# Patient Record
Sex: Female | Born: 1975 | Race: Asian | Hispanic: No | Marital: Married | Smoking: Never smoker
Health system: Southern US, Community
[De-identification: ages and names within clinical notes are randomized; demographics above are authoritative.]

## PROBLEM LIST (undated history)

## (undated) HISTORY — PX: NO PAST SURGERIES: SHX2092

---

## 2010-09-30 ENCOUNTER — Ambulatory Visit: Payer: Self-pay | Admitting: Internal Medicine

## 2013-05-06 ENCOUNTER — Ambulatory Visit: Payer: Self-pay | Admitting: Internal Medicine

## 2013-05-06 LAB — HM MAMMOGRAPHY: HM MAMMO: NORMAL

## 2014-07-07 LAB — LIPID PANEL
Cholesterol: 225 mg/dL — AB (ref 0–200)
HDL: 45 mg/dL (ref 35–70)
LDL Cholesterol: 159 mg/dL
TRIGLYCERIDES: 107 mg/dL (ref 40–160)

## 2014-07-07 LAB — HM PAP SMEAR: HM PAP: NEGATIVE

## 2014-07-07 LAB — BASIC METABOLIC PANEL
BUN: 9 mg/dL (ref 4–21)
CREATININE: 0.8 mg/dL (ref <=1.1)

## 2014-07-07 LAB — CBC AND DIFFERENTIAL
HCT: 34 % — AB (ref 36–46)
Hemoglobin: 10.6 g/dL — AB (ref 12.0–16.0)

## 2014-07-07 LAB — TSH: TSH: 3 u[IU]/mL (ref <=5.90)

## 2015-04-20 ENCOUNTER — Encounter: Payer: Self-pay | Admitting: Internal Medicine

## 2015-06-09 ENCOUNTER — Encounter: Payer: Self-pay | Admitting: Internal Medicine

## 2015-06-09 ENCOUNTER — Other Ambulatory Visit: Payer: Self-pay | Admitting: Internal Medicine

## 2015-06-09 DIAGNOSIS — M069 Rheumatoid arthritis, unspecified: Secondary | ICD-10-CM | POA: Insufficient documentation

## 2015-06-09 DIAGNOSIS — R011 Cardiac murmur, unspecified: Secondary | ICD-10-CM | POA: Insufficient documentation

## 2015-06-09 DIAGNOSIS — J3089 Other allergic rhinitis: Secondary | ICD-10-CM | POA: Insufficient documentation

## 2015-06-09 DIAGNOSIS — F5101 Primary insomnia: Secondary | ICD-10-CM | POA: Insufficient documentation

## 2015-07-17 ENCOUNTER — Ambulatory Visit (INDEPENDENT_AMBULATORY_CARE_PROVIDER_SITE_OTHER): Payer: Managed Care, Other (non HMO) | Admitting: Internal Medicine

## 2015-07-17 ENCOUNTER — Encounter: Payer: Self-pay | Admitting: Internal Medicine

## 2015-07-17 VITALS — BP 118/64 | HR 68 | Ht 64.0 in | Wt 122.4 lb

## 2015-07-17 DIAGNOSIS — M05742 Rheumatoid arthritis with rheumatoid factor of left hand without organ or systems involvement: Secondary | ICD-10-CM

## 2015-07-17 DIAGNOSIS — Z1239 Encounter for other screening for malignant neoplasm of breast: Secondary | ICD-10-CM | POA: Diagnosis not present

## 2015-07-17 DIAGNOSIS — Z0000 Encounter for general adult medical examination without abnormal findings: Secondary | ICD-10-CM

## 2015-07-17 DIAGNOSIS — M05741 Rheumatoid arthritis with rheumatoid factor of right hand without organ or systems involvement: Secondary | ICD-10-CM

## 2015-07-17 DIAGNOSIS — E785 Hyperlipidemia, unspecified: Secondary | ICD-10-CM | POA: Diagnosis not present

## 2015-07-17 LAB — POCT URINALYSIS DIPSTICK
Bilirubin, UA: NEGATIVE
GLUCOSE UA: NEGATIVE
Ketones, UA: NEGATIVE
Leukocytes, UA: NEGATIVE
Nitrite, UA: NEGATIVE
PH UA: 6
Protein, UA: NEGATIVE
SPEC GRAV UA: 1.015
UROBILINOGEN UA: 0.2

## 2015-07-17 NOTE — Progress Notes (Signed)
Date:  07/17/2015   Name:  Theadora Noyes Chamberland   DOB:  1976/04/18   MRN:  [id number]   Chief Complaint: Annual Exam Melinda House is a 40 y.o. female who presents today for her Complete Annual Exam. She feels fairly well. She reports exercising some. She reports she is sleeping fairly well.   Rheumatoid Arthritis - generally worsening symptoms in both hands and wrists.  Specialist wanted her to start strong medication but she declined.  Now she is using Aleve only as needed - not daily even though she has daily symptoms. She is having more trouble with ADLS such as dressing and eating.  Review of Systems  Constitutional: Negative for fever, chills and fatigue.  HENT: Negative for hearing loss.   Eyes: Negative for visual disturbance.  Respiratory: Negative for cough, chest tightness, shortness of breath and wheezing.   Cardiovascular: Negative for chest pain, palpitations and leg swelling.  Gastrointestinal: Negative for abdominal pain, diarrhea and constipation.  Genitourinary: Negative for dysuria.  Musculoskeletal: Positive for myalgias, joint swelling and arthralgias. Negative for gait problem.  Neurological: Negative for dizziness, numbness and headaches.  Hematological: Negative for adenopathy.  Psychiatric/Behavioral: Negative for dysphoric mood.    Patient Active Problem List   Diagnosis Date Noted  . Hyperlipidemia, mild 07/17/2015  . Environmental and seasonal allergies 06/09/2015  . Cardiac murmur 06/09/2015  . Idiopathic insomnia 06/09/2015  . Rheumatoid arthritis of hand ([place]) 06/09/2015    Prior to Admission medications   Medication Sig Start Date End Date Taking? Authorizing Provider  Azelastine-Fluticasone (DYMISTA) 137-50 MCG/ACT SUSP Place 1 spray into the nose 2 (two) times daily as needed. 06/18/13  Yes Historical Provider, MD  naproxen sodium (ALEVE) 220 MG tablet Take 1 tablet by mouth 2 (two) times daily as needed.   Yes Historical Provider, MD    zaleplon (SONATA) 5 MG capsule Take 1 capsule by mouth at bedtime as needed. Reported on 07/17/2015 06/18/13   Historical Provider, MD    No Known Allergies  Past Surgical History  Procedure Laterality Date  . No past surgeries      Social History  Substance Use Topics  . Smoking status: Never Smoker   . Smokeless tobacco: None  . Alcohol Use: 1.2 oz/week    2 Standard drinks or equivalent per week     Comment: occasional    Medication list has been reviewed and updated.  Physical Exam  Constitutional: She is oriented to person, place, and time. She appears well-developed and well-nourished. No distress.  HENT:  Head: Normocephalic and atraumatic.  Right Ear: Tympanic membrane and ear canal normal.  Left Ear: Tympanic membrane and ear canal normal.  Nose: Right sinus exhibits no maxillary sinus tenderness. Left sinus exhibits no maxillary sinus tenderness.  Mouth/Throat: Uvula is midline and oropharynx is clear and moist.  Eyes: Conjunctivae and EOM are normal. Right eye exhibits no discharge. Left eye exhibits no discharge. No scleral icterus.  Neck: Normal range of motion. Carotid bruit is not present. No erythema present. No thyromegaly present.  Cardiovascular: Normal rate, regular rhythm, normal heart sounds and normal pulses.   Pulmonary/Chest: Effort normal. No respiratory distress. She has no wheezes. Right breast exhibits no mass, no nipple discharge, no skin change and no tenderness. Left breast exhibits no mass, no nipple discharge, no skin change and no tenderness.  Abdominal: Soft. Bowel sounds are normal. There is no hepatosplenomegaly. There is no tenderness. There is no CVA tenderness.  Musculoskeletal: Normal range  of motion.       Right wrist: She exhibits tenderness and swelling.       Left wrist: She exhibits tenderness and swelling.  Swelling and synovitis with tenderness of both wrists, and 3rd and 4th fingers of left hand  Lymphadenopathy:    She has no  cervical adenopathy.    She has no axillary adenopathy.  Neurological: She is alert and oriented to person, place, and time. She has normal reflexes. No cranial nerve deficit or sensory deficit.  Skin: Skin is warm, dry and intact. No rash noted.  Psychiatric: She has a normal mood and affect. Her speech is normal and behavior is normal. Thought content normal.  Nursing note and vitals reviewed.   BP 118/64 mmHg  Pulse 68  Ht 5' 4"  (1.626 m)  Wt 122 lb 6.4 oz (55.52 kg)  BMI 21.00 kg/m2  Assessment and Plan: 1. Annual physical exam Normal exam except for arthritis - POCT Urinalysis Dipstick - Comprehensive metabolic panel - TSH  2. Rheumatoid arthritis involving both hands with positive rheumatoid factor (HCC) Increasedactivity of disease recently - pt encouraged to begin more aggressive treatment Refer back to rheumatology Take Aleve twice a day - Rheumatoid Factor - Cyclic citrul peptide antibody, IgG - CBC with Differential/Platelet - Sedimentation rate - Ambulatory referral to Rheumatology  3. Hyperlipidemia, mild Continue healthy diet - Lipid panel  4. Breast cancer screening - MM DIGITAL SCREENING BILATERAL; Future   Halina Maidens, MD [facility]  07/17/2015

## 2015-07-17 NOTE — Patient Instructions (Signed)
Breast Self-Awareness Practicing breast self-awareness may pick up problems early, prevent significant medical complications, and possibly save your life. By practicing breast self-awareness, you can become familiar with how your breasts look and feel and if your breasts are changing. This allows you to notice changes early. It can also offer you some reassurance that your breast health is good. One way to learn what is normal for your breasts and whether your breasts are changing is to do a breast self-exam. If you find a lump or something that was not present in the past, it is best to contact your caregiver right away. Other findings that should be evaluated by your caregiver include nipple discharge, especially if it is bloody; skin changes or reddening; areas where the skin seems to be pulled in (retracted); or new lumps and bumps. Breast pain is seldom associated with cancer (malignancy), but should also be evaluated by a caregiver. HOW TO PERFORM A BREAST SELF-EXAM The best time to examine your breasts is 5-7 days after your menstrual period is over. During menstruation, the breasts are lumpier, and it may be more difficult to pick up changes. If you do not menstruate, have reached menopause, or had your uterus removed (hysterectomy), you should examine your breasts at regular intervals, such as monthly. If you are breastfeeding, examine your breasts after a feeding or after using a breast pump. Breast implants do not decrease the risk for lumps or tumors, so continue to perform breast self-exams as recommended. Talk to your caregiver about how to determine the difference between the implant and breast tissue. Also, talk about the amount of pressure you should use during the exam. Over time, you will become more familiar with the variations of your breasts and more comfortable with the exam. A breast self-exam requires you to remove all your clothes above the waist. 1. Look at your breasts and nipples.  Stand in front of a mirror in a room with good lighting. With your hands on your hips, push your hands firmly downward. Look for a difference in shape, contour, and size from one breast to the other (asymmetry). Asymmetry includes puckers, dips, or bumps. Also, look for skin changes, such as reddened or scaly areas on the breasts. Look for nipple changes, such as discharge, dimpling, repositioning, or redness. 2. Carefully feel your breasts. This is best done either in the shower or tub while using soapy water or when flat on your back. Place the arm (on the side of the breast you are examining) above your head. Use the pads (not the fingertips) of your three middle fingers on your opposite hand to feel your breasts. Start in the underarm area and use  inch (2 cm) overlapping circles to feel your breast. Use 3 different levels of pressure (light, medium, and firm pressure) at each circle before moving to the next circle. The light pressure is needed to feel the tissue closest to the skin. The medium pressure will help to feel breast tissue a little deeper, while the firm pressure is needed to feel the tissue close to the ribs. Continue the overlapping circles, moving downward over the breast until you feel your ribs below your breast. Then, move one finger-width towards the center of the body. Continue to use the  inch (2 cm) overlapping circles to feel your breast as you move slowly up toward the collar bone (clavicle) near the base of the neck. Continue the up and down exam using all 3 pressures until you reach the  middle of the chest. Do this with each breast, carefully feeling for lumps or changes. 3.  Keep a written record with breast changes or normal findings for each breast. By writing this information down, you do not need to depend only on memory for size, tenderness, or location. Write down where you are in your menstrual cycle, if you are still menstruating. Breast tissue can have some lumps or  thick tissue. However, see your caregiver if you find anything that concerns you.  SEEK MEDICAL CARE IF:  You see a change in shape, contour, or size of your breasts or nipples.   You see skin changes, such as reddened or scaly areas on the breasts or nipples.   You have an unusual discharge from your nipples.   You feel a new lump or unusually thick areas.    This information is not intended to replace advice given to you by your health care provider. Make sure you discuss any questions you have with your health care provider.   Document Released: 06/27/2005 Document Revised: 06/13/2012 Document Reviewed: 10/12/2011 Elsevier Interactive Patient Education [employer].

## 2015-07-20 LAB — LIPID PANEL
CHOL/HDL RATIO: 5 ratio — AB (ref 0.0–4.4)
Cholesterol, Total: 214 mg/dL — ABNORMAL HIGH (ref 100–199)
HDL: 43 mg/dL (ref >39)
LDL Calculated: 134 mg/dL — ABNORMAL HIGH (ref 0–99)
Triglycerides: 185 mg/dL — ABNORMAL HIGH (ref 0–149)
VLDL Cholesterol Cal: 37 mg/dL (ref 5–40)

## 2015-07-20 LAB — COMPREHENSIVE METABOLIC PANEL
A/G RATIO: 1.4 (ref 1.1–2.5)
ALBUMIN: 4.4 g/dL (ref 3.5–5.5)
ALT: 13 IU/L (ref 0–32)
AST: 18 IU/L (ref 0–40)
Alkaline Phosphatase: 85 IU/L (ref 39–117)
BILIRUBIN TOTAL: 0.4 mg/dL (ref 0.0–1.2)
BUN / CREAT RATIO: 12 (ref 8–20)
BUN: 9 mg/dL (ref 6–20)
CHLORIDE: 98 mmol/L (ref 96–106)
CO2: 19 mmol/L (ref 18–29)
Calcium: 9.9 mg/dL (ref 8.7–10.2)
Creatinine, Ser: 0.77 mg/dL (ref 0.57–1.00)
GFR calc non Af Amer: 98 mL/min/{1.73_m2} (ref >59)
GFR, EST AFRICAN AMERICAN: 112 mL/min/{1.73_m2} (ref >59)
GLOBULIN, TOTAL: 3.2 g/dL (ref 1.5–4.5)
Glucose: 87 mg/dL (ref 65–99)
POTASSIUM: 5.2 mmol/L (ref 3.5–5.2)
SODIUM: 138 mmol/L (ref 134–144)
Total Protein: 7.6 g/dL (ref 6.0–8.5)

## 2015-07-20 LAB — CBC WITH DIFFERENTIAL/PLATELET
BASOS ABS: 0.1 10*3/uL (ref 0.0–0.2)
Basos: 2 %
EOS (ABSOLUTE): 0.4 10*3/uL (ref 0.0–0.4)
EOS: 6 %
HEMATOCRIT: 35.5 % (ref 34.0–46.6)
Hemoglobin: 10.8 g/dL — ABNORMAL LOW (ref 11.1–15.9)
Immature Grans (Abs): 0 10*3/uL (ref 0.0–0.1)
Immature Granulocytes: 0 %
LYMPHS ABS: 1.7 10*3/uL (ref 0.7–3.1)
Lymphs: 25 %
MCH: 22.1 pg — ABNORMAL LOW (ref 26.6–33.0)
MCHC: 30.4 g/dL — AB (ref 31.5–35.7)
MCV: 73 fL — ABNORMAL LOW (ref 79–97)
MONOS ABS: 0.4 10*3/uL (ref 0.1–0.9)
Monocytes: 6 %
Neutrophils Absolute: 4.1 10*3/uL (ref 1.4–7.0)
Neutrophils: 61 %
Platelets: 603 10*3/uL — ABNORMAL HIGH (ref 150–379)
RBC: 4.89 x10E6/uL (ref 3.77–5.28)
RDW: 17.5 % — AB (ref 12.3–15.4)
WBC: 6.7 10*3/uL (ref 3.4–10.8)

## 2015-07-20 LAB — RHEUMATOID FACTOR: Rhuematoid fact SerPl-aCnc: 122.8 IU/mL — ABNORMAL HIGH (ref 0.0–13.9)

## 2015-07-20 LAB — TSH: TSH: 1.96 u[IU]/mL (ref 0.450–4.500)

## 2015-07-20 LAB — SEDIMENTATION RATE: SED RATE: 17 mm/h (ref 0–32)

## 2015-07-20 LAB — CYCLIC CITRUL PEPTIDE ANTIBODY, IGG/IGA

## 2015-07-21 ENCOUNTER — Telehealth: Payer: Self-pay

## 2015-07-21 NOTE — Telephone Encounter (Signed)
Spoke with pt. Pt. Advised of results. Patient will obtain copy of results from My Chart. Patient verbalized understanding. [facility]

## 2015-07-21 NOTE — Telephone Encounter (Signed)
-----   Message from Glean Hess, MD sent at 07/20/2015 10:20 AM EST ----- Mild anemia is stable.  RF and CCP are very high.  Other labs are normal.

## 2015-07-27 ENCOUNTER — Encounter: Payer: Self-pay | Admitting: Internal Medicine

## 2015-09-08 ENCOUNTER — Telehealth: Payer: Self-pay

## 2015-09-08 NOTE — Telephone Encounter (Signed)
Patient called in today and states that she is having a lot of right arm pain radiating up to shoulder. She states that when she picked something up the other day she felt like something tore and is having a hard time picking things up. She would like to know you could place a referral or if she would need to come in and be seen?

## 2015-09-09 NOTE — Telephone Encounter (Signed)
She might start by consulting her Rheumatologist.  It may be easier to get in right away.  They can then tell her if she needs to see Orthopedics.

## 2015-09-09 NOTE — Telephone Encounter (Signed)
Spoke with pt. Pt. will call rheumatology.

## 2015-09-24 ENCOUNTER — Ambulatory Visit: Payer: Self-pay

## 2015-10-02 ENCOUNTER — Encounter: Payer: Self-pay | Admitting: Internal Medicine

## 2015-10-08 ENCOUNTER — Ambulatory Visit
Admission: RE | Admit: 2015-10-08 | Discharge: 2015-10-08 | Disposition: A | Discharge status: Home / Self Care | Payer: Managed Care, Other (non HMO) | Source: Ambulatory Visit | Attending: Internal Medicine | Admitting: Internal Medicine

## 2015-10-08 DIAGNOSIS — Z1231 Encounter for screening mammogram for malignant neoplasm of breast: Secondary | ICD-10-CM | POA: Diagnosis not present

## 2015-10-08 DIAGNOSIS — Z1239 Encounter for other screening for malignant neoplasm of breast: Secondary | ICD-10-CM

## 2016-11-24 ENCOUNTER — Other Ambulatory Visit: Payer: Self-pay | Admitting: Internal Medicine

## 2016-11-24 DIAGNOSIS — Z1231 Encounter for screening mammogram for malignant neoplasm of breast: Secondary | ICD-10-CM

## 2016-12-21 ENCOUNTER — Ambulatory Visit
Admission: RE | Admit: 2016-12-21 | Discharge: 2016-12-21 | Disposition: A | Discharge status: Home / Self Care | Payer: Managed Care, Other (non HMO) | Source: Ambulatory Visit | Attending: Internal Medicine | Admitting: Internal Medicine

## 2016-12-21 DIAGNOSIS — Z1231 Encounter for screening mammogram for malignant neoplasm of breast: Secondary | ICD-10-CM | POA: Diagnosis present

## 2017-05-08 ENCOUNTER — Encounter: Payer: Self-pay | Admitting: Internal Medicine

## 2017-05-12 ENCOUNTER — Ambulatory Visit (INDEPENDENT_AMBULATORY_CARE_PROVIDER_SITE_OTHER): Payer: Managed Care, Other (non HMO) | Admitting: Internal Medicine

## 2017-05-12 ENCOUNTER — Encounter: Payer: Self-pay | Admitting: Internal Medicine

## 2017-05-12 VITALS — BP 114/70 | HR 90 | Ht 64.0 in | Wt 131.0 lb

## 2017-05-12 DIAGNOSIS — M05742 Rheumatoid arthritis with rheumatoid factor of left hand without organ or systems involvement: Secondary | ICD-10-CM | POA: Diagnosis not present

## 2017-05-12 DIAGNOSIS — M05741 Rheumatoid arthritis with rheumatoid factor of right hand without organ or systems involvement: Secondary | ICD-10-CM

## 2017-05-12 DIAGNOSIS — F419 Anxiety disorder, unspecified: Secondary | ICD-10-CM | POA: Diagnosis not present

## 2017-05-12 MED ORDER — ALPRAZOLAM 0.25 MG PO TABS: 0.2500 mg | ORAL_TABLET | Freq: Two times a day (BID) | ORAL | 0 refills | Status: DC | PRN

## 2017-05-12 NOTE — Progress Notes (Signed)
Date:  05/12/2017   Name:  Melinda House   DOB:  13-Oct-1975   MRN:  [id number]   Chief Complaint: Anxiety (Patient is going on a trip to [place]. Nervous about the flight. Would like medication to ease anxiety during the long flight. )  She is going home to [place] for the first time in 4 years.  She has anxiety about the family dynamics and trouble sleeping when she is away.  She does not want any long term medication, just something to take as needed.  She has never taken medications of this type.   Anxiety  Presents for initial visit. Onset was 6 to 12 months ago. The problem has been waxing and waning. Symptoms include nervous/anxious behavior. Patient reports no chest pain, decreased concentration, dizziness, palpitations or shortness of breath.    RA - taking Aleve as needed.  Remarkably, she has eliminated plants from the nightshade family and has had no pain.  If she eats even a small amount her hands will be painful the next day.    Review of Systems  Constitutional: Negative for chills, fatigue and fever.  Respiratory: Negative for choking and shortness of breath.   Cardiovascular: Negative for chest pain and palpitations.  Musculoskeletal: Positive for arthralgias.  Neurological: Negative for dizziness and headaches.  Psychiatric/Behavioral: Positive for sleep disturbance. Negative for decreased concentration and dysphoric mood. The patient is nervous/anxious.     Patient Active Problem List   Diagnosis Date Noted  . Hyperlipidemia, mild 07/17/2015  . Environmental and seasonal allergies 06/09/2015  . Cardiac murmur 06/09/2015  . Idiopathic insomnia 06/09/2015  . Rheumatoid arthritis of hand ([place]) 06/09/2015    Prior to Admission medications   Medication Sig Start Date End Date Taking? Authorizing Provider  Azelastine-Fluticasone (DYMISTA) 137-50 MCG/ACT SUSP Place 1 spray into the nose 2 (two) times daily as needed. 06/18/13  Yes [provider]    naproxen sodium (ALEVE) 220 MG tablet Take 1 tablet by mouth 2 (two) times daily as needed.   Yes [provider]    No Known Allergies  Past Surgical History:  Procedure Laterality Date  . NO PAST SURGERIES      Social History  Substance Use Topics  . Smoking status: Never Smoker  . Smokeless tobacco: Never Used  . Alcohol use 1.2 oz/week    2 Standard drinks or equivalent per week     Comment: occasional     Medication list has been reviewed and updated.  PHQ 2/9 Scores 05/12/2017  PHQ - 2 Score 1    Physical Exam  Constitutional: She is oriented to person, place, and time. She appears well-developed. No distress.  HENT:  Head: Normocephalic and atraumatic.  Pulmonary/Chest: Effort normal. No respiratory distress.  Musculoskeletal: Normal range of motion.  MCPs both hands without synovitis or deformity. Grip normal  Neurological: She is alert and oriented to person, place, and time.  Skin: Skin is warm and dry. No rash noted.  Psychiatric: She has a normal mood and affect. Her behavior is normal. Thought content normal.  Nursing note and vitals reviewed.   BP 114/70   Pulse 90   Ht 5' 4"  (1.626 m)   Wt 131 lb (59.4 kg)   LMP 04/24/2017 (Exact Date)   SpO2 100%   BMI 22.49 kg/m   Assessment and Plan: 1. Anxiety Will try PRN xanax - ALPRAZolam (XANAX) 0.25 MG tablet; Take 1 tablet (0.25 mg total) by mouth 2 (two) times daily  as needed for anxiety.  Dispense: 30 tablet; Refill: 0  2. Rheumatoid arthritis involving both hands with positive rheumatoid factor ([place]) Doing well   Meds ordered this encounter  Medications  . ALPRAZolam (XANAX) 0.25 MG tablet    Sig: Take 1 tablet (0.25 mg total) by mouth 2 (two) times daily as needed for anxiety.    Dispense:  30 tablet    Refill:  0    Partially dictated using Editor, commissioning. Any errors are unintentional.  Halina Maidens, MD [facility]  05/12/2017

## 2017-05-12 NOTE — Patient Instructions (Signed)
Take alprazolam 0.25 mg - 1/2 to 1 twice a day AS NEEDED

## 2017-07-27 ENCOUNTER — Encounter: Payer: Self-pay | Admitting: Internal Medicine

## 2017-07-27 ENCOUNTER — Ambulatory Visit (INDEPENDENT_AMBULATORY_CARE_PROVIDER_SITE_OTHER): Payer: Managed Care, Other (non HMO) | Admitting: Internal Medicine

## 2017-07-27 VITALS — BP 110/76 | HR 87 | Ht 64.0 in | Wt 131.0 lb

## 2017-07-27 DIAGNOSIS — M05742 Rheumatoid arthritis with rheumatoid factor of left hand without organ or systems involvement: Secondary | ICD-10-CM

## 2017-07-27 DIAGNOSIS — M05741 Rheumatoid arthritis with rheumatoid factor of right hand without organ or systems involvement: Secondary | ICD-10-CM

## 2017-07-27 DIAGNOSIS — Z1239 Encounter for other screening for malignant neoplasm of breast: Secondary | ICD-10-CM

## 2017-07-27 DIAGNOSIS — F5101 Primary insomnia: Secondary | ICD-10-CM

## 2017-07-27 DIAGNOSIS — Z0000 Encounter for general adult medical examination without abnormal findings: Secondary | ICD-10-CM | POA: Diagnosis not present

## 2017-07-27 DIAGNOSIS — E785 Hyperlipidemia, unspecified: Secondary | ICD-10-CM

## 2017-07-27 LAB — POCT URINALYSIS DIPSTICK
Bilirubin, UA: NEGATIVE
Glucose, UA: NEGATIVE
KETONES UA: NEGATIVE
Nitrite, UA: NEGATIVE
PH UA: 6 (ref 5.0–8.0)
Protein, UA: NEGATIVE
Spec Grav, UA: 1.01 (ref 1.010–1.025)
UROBILINOGEN UA: 0.2 U/dL

## 2017-07-27 NOTE — Progress Notes (Signed)
Date:  07/27/2017   Name:  Melinda House   DOB:  [birth year]   MRN:  [id number]   Chief Complaint: Annual Exam (Breast Exam.) Melinda House is a 42 y.o. female who presents today for her Complete Annual Exam. She feels well. She reports exercising walking. She reports she is sleeping well. Menses are regular, no abnormal bleeding or pain.  She denies breast issues.  Mammogram due in June.  Insomnia  Primary symptoms: no sleep disturbance.  The problem occurs intermittently. Past treatments include medication. The treatment provided significant (with prn alprazolam) relief.   RA - still managing with diet.  Occasionally has flares in hands, thumbs and feet.  Taking Aleve intermittently and avoiding certain foods.  Review of Systems  Constitutional: Negative for chills, fatigue and fever.  HENT: Negative for congestion, hearing loss, tinnitus, trouble swallowing and voice change.   Eyes: Negative for visual disturbance.  Respiratory: Negative for cough, chest tightness, shortness of breath and wheezing.   Cardiovascular: Negative for chest pain, palpitations and leg swelling.  Gastrointestinal: Negative for abdominal pain, constipation, diarrhea and vomiting.  Endocrine: Negative for polydipsia and polyuria.  Genitourinary: Negative for dysuria, frequency, genital sores, vaginal bleeding and vaginal discharge.  Musculoskeletal: Positive for arthralgias and joint swelling. Negative for gait problem.  Skin: Negative for color change and rash.  Allergic/Immunologic: Positive for environmental allergies.  Neurological: Negative for dizziness, tremors, light-headedness and headaches.  Hematological: Negative for adenopathy. Does not bruise/bleed easily.  Psychiatric/Behavioral: Negative for dysphoric mood and sleep disturbance. The patient has insomnia. The patient is not nervous/anxious.     Patient Active Problem List   Diagnosis Date Noted  . Hyperlipidemia, mild 07/17/2015   . Environmental and seasonal allergies 06/09/2015  . Cardiac murmur 06/09/2015  . Idiopathic insomnia 06/09/2015  . Rheumatoid arthritis of hand ([place]) 06/09/2015    Prior to Admission medications   Medication Sig Start Date End Date Taking? Authorizing Provider  ALPRAZolam (XANAX) 0.25 MG tablet Take 1 tablet (0.25 mg total) by mouth 2 (two) times daily as needed for anxiety. 05/12/17  Yes Glean Hess, MD  Azelastine-Fluticasone [facility]) 137-50 MCG/ACT SUSP Place 1 spray into the nose 2 (two) times daily as needed. 06/18/13  Yes [provider]  naproxen sodium (ALEVE) 220 MG tablet Take 1 tablet by mouth 2 (two) times daily as needed.   Yes [provider]    No Known Allergies  Past Surgical History:  Procedure Laterality Date  . NO PAST SURGERIES      Social History   Tobacco Use  . Smoking status: Never Smoker  . Smokeless tobacco: Never Used  Substance Use Topics  . Alcohol use: Yes    Alcohol/week: 1.2 oz    Types: 2 Standard drinks or equivalent per week    Comment: occasional  . Drug use: No     Medication list has been reviewed and updated.  PHQ 2/9 Scores 05/12/2017  PHQ - 2 Score 1    Physical Exam  Constitutional: She is oriented to person, place, and time. She appears well-developed and well-nourished. No distress.  HENT:  Head: Normocephalic and atraumatic.  Right Ear: Tympanic membrane and ear canal normal.  Left Ear: Tympanic membrane and ear canal normal.  Nose: Right sinus exhibits no maxillary sinus tenderness. Left sinus exhibits no maxillary sinus tenderness.  Mouth/Throat: Uvula is midline and oropharynx is clear and moist.  Eyes: Conjunctivae and EOM are normal. Right eye exhibits no discharge. Left  eye exhibits no discharge. No scleral icterus.  Neck: Normal range of motion. Carotid bruit is not present. No erythema present. No thyromegaly present.  Cardiovascular: Normal rate, regular rhythm, normal heart sounds and  normal pulses.  Pulmonary/Chest: Effort normal. No respiratory distress. She has no wheezes. Right breast exhibits no mass, no nipple discharge, no skin change and no tenderness. Left breast exhibits no mass, no nipple discharge, no skin change and no tenderness.  Abdominal: Soft. Bowel sounds are normal. There is no hepatosplenomegaly. There is no tenderness. There is no CVA tenderness.  Musculoskeletal: Normal range of motion.  Warmth and synovitis of base of right thumb.  Lymphadenopathy:    She has no cervical adenopathy.    She has no axillary adenopathy.  Neurological: She is alert and oriented to person, place, and time. She has normal reflexes. No cranial nerve deficit or sensory deficit.  Skin: Skin is warm, dry and intact. No rash noted.  Psychiatric: She has a normal mood and affect. Her speech is normal and behavior is normal. Thought content normal.  Nursing note and vitals reviewed.   BP 110/76   Pulse 87   Ht 5' 4"  (1.626 m)   Wt 131 lb (59.4 kg)   LMP 07/13/2017 (Exact Date)   SpO2 100%   BMI 22.49 kg/m   Assessment and Plan: 1. Annual physical exam Normal exam Continue annual eye exams - TSH - POCT urinalysis dipstick  2. Breast cancer screening Continue annually - MM DIGITAL SCREENING BILATERAL; Future  3. Rheumatoid arthritis involving both hands with positive rheumatoid factor (HCC) Continue diet and aleve prn - CBC with Differential/Platelet - Comprehensive metabolic panel - Rheumatoid factor - Sedimentation rate - CYCLIC CITRUL PEPTIDE ANTIBODY, IGG/IGA  4. Hyperlipidemia, mild - Lipid panel  5. Idiopathic insomnia Continue prn alprazolam   No orders of the defined types were placed in this encounter.   Partially dictated using Editor, commissioning. Any errors are unintentional.  Halina Maidens, MD [facility]  07/27/2017

## 2017-07-29 LAB — COMPREHENSIVE METABOLIC PANEL
ALT: 16 IU/L (ref 0–32)
AST: 18 IU/L (ref 0–40)
Albumin/Globulin Ratio: 1.6 (ref 1.2–2.2)
Albumin: 4.7 g/dL (ref 3.5–5.5)
Alkaline Phosphatase: 79 IU/L (ref 39–117)
BUN/Creatinine Ratio: 13 (ref 9–23)
BUN: 9 mg/dL (ref 6–24)
Bilirubin Total: 0.4 mg/dL (ref 0.0–1.2)
CALCIUM: 9.5 mg/dL (ref 8.7–10.2)
CO2: 19 mmol/L — AB (ref 20–29)
CREATININE: 0.68 mg/dL (ref 0.57–1.00)
Chloride: 99 mmol/L (ref 96–106)
GFR calc Af Amer: 126 mL/min/{1.73_m2} (ref >59)
GFR, EST NON AFRICAN AMERICAN: 109 mL/min/{1.73_m2} (ref >59)
GLOBULIN, TOTAL: 3 g/dL (ref 1.5–4.5)
Glucose: 83 mg/dL (ref 65–99)
Potassium: 4.8 mmol/L (ref 3.5–5.2)
SODIUM: 138 mmol/L (ref 134–144)
TOTAL PROTEIN: 7.7 g/dL (ref 6.0–8.5)

## 2017-07-29 LAB — RHEUMATOID FACTOR: Rhuematoid fact SerPl-aCnc: 63.5 IU/mL — ABNORMAL HIGH (ref 0.0–13.9)

## 2017-07-29 LAB — CBC WITH DIFFERENTIAL/PLATELET
Basophils Absolute: 0.1 10*3/uL (ref 0.0–0.2)
Basos: 1 %
EOS (ABSOLUTE): 0.3 10*3/uL (ref 0.0–0.4)
EOS: 5 %
HEMATOCRIT: 36.7 % (ref 34.0–46.6)
Hemoglobin: 11.7 g/dL (ref 11.1–15.9)
IMMATURE GRANS (ABS): 0 10*3/uL (ref 0.0–0.1)
Immature Granulocytes: 0 %
LYMPHS: 23 %
Lymphocytes Absolute: 1.7 10*3/uL (ref 0.7–3.1)
MCH: 23.7 pg — ABNORMAL LOW (ref 26.6–33.0)
MCHC: 31.9 g/dL (ref 31.5–35.7)
MCV: 74 fL — ABNORMAL LOW (ref 79–97)
Monocytes Absolute: 0.6 10*3/uL (ref 0.1–0.9)
Monocytes: 8 %
NEUTROS PCT: 63 %
Neutrophils Absolute: 4.8 10*3/uL (ref 1.4–7.0)
Platelets: 465 10*3/uL — ABNORMAL HIGH (ref 150–379)
RBC: 4.93 x10E6/uL (ref 3.77–5.28)
RDW: 17.2 % — AB (ref 12.3–15.4)
WBC: 7.5 10*3/uL (ref 3.4–10.8)

## 2017-07-29 LAB — LIPID PANEL
CHOL/HDL RATIO: 4.8 ratio — AB (ref 0.0–4.4)
Cholesterol, Total: 228 mg/dL — ABNORMAL HIGH (ref 100–199)
HDL: 48 mg/dL (ref >39)
LDL CALC: 154 mg/dL — AB (ref 0–99)
TRIGLYCERIDES: 129 mg/dL (ref 0–149)
VLDL Cholesterol Cal: 26 mg/dL (ref 5–40)

## 2017-07-29 LAB — SEDIMENTATION RATE: Sed Rate: 29 mm/hr (ref 0–32)

## 2017-07-29 LAB — TSH: TSH: 1.63 u[IU]/mL (ref 0.450–4.500)

## 2017-07-29 LAB — CYCLIC CITRUL PEPTIDE ANTIBODY, IGG/IGA: Cyclic Citrullin Peptide Ab: >250 units — ABNORMAL HIGH (ref 0–19)

## 2017-08-21 ENCOUNTER — Other Ambulatory Visit: Payer: Self-pay | Admitting: Internal Medicine

## 2017-08-21 DIAGNOSIS — F419 Anxiety disorder, unspecified: Secondary | ICD-10-CM

## 2017-11-01 ENCOUNTER — Other Ambulatory Visit: Payer: Self-pay | Admitting: Internal Medicine

## 2017-11-01 DIAGNOSIS — F419 Anxiety disorder, unspecified: Secondary | ICD-10-CM

## 2017-11-02 ENCOUNTER — Ambulatory Visit
Admission: RE | Admit: 2017-11-02 | Discharge: 2017-11-02 | Disposition: A | Discharge status: Home / Self Care | Payer: Managed Care, Other (non HMO) | Source: Ambulatory Visit | Attending: Internal Medicine | Admitting: Internal Medicine

## 2017-11-02 ENCOUNTER — Encounter: Payer: Self-pay | Admitting: Internal Medicine

## 2017-11-02 ENCOUNTER — Ambulatory Visit: Payer: Managed Care, Other (non HMO) | Admitting: Internal Medicine

## 2017-11-02 VITALS — BP 108/70 | HR 96 | Ht 64.0 in | Wt 134.0 lb

## 2017-11-02 DIAGNOSIS — R0602 Shortness of breath: Secondary | ICD-10-CM | POA: Diagnosis not present

## 2017-11-02 DIAGNOSIS — M05741 Rheumatoid arthritis with rheumatoid factor of right hand without organ or systems involvement: Secondary | ICD-10-CM | POA: Diagnosis not present

## 2017-11-02 DIAGNOSIS — M05742 Rheumatoid arthritis with rheumatoid factor of left hand without organ or systems involvement: Secondary | ICD-10-CM | POA: Diagnosis not present

## 2017-11-02 NOTE — Progress Notes (Signed)
Date:  11/02/2017   Name:  Melinda House   DOB:  1976-06-28   MRN:  [id number]   Chief Complaint: Fatigue (Started 2 weeks ago- was down with bad cold. Body hurts/aching. In right hand fingers she has shooting pains when trying to move fingers. Doing any activity causes more pain in body. )  Fatigue that has persisted since a chest cold several weeks ago.  Still has sob and congestion but no sputum, fever or chills. Fingers still on right hand, ongoing wrist pain.  Taking Aleve intermittently.    Review of Systems  Constitutional: Positive for fatigue. Negative for chills and fever.  Respiratory: Positive for chest tightness and shortness of breath. Negative for cough and wheezing.   Cardiovascular: Negative for chest pain, palpitations and leg swelling.  Gastrointestinal: Negative for constipation, diarrhea, nausea and vomiting.  Musculoskeletal: Positive for arthralgias and joint swelling.       Triggering and stiffness of 4th and 5th fingers right  Psychiatric/Behavioral: Negative for dysphoric mood and sleep disturbance.    Patient Active Problem List   Diagnosis Date Noted  . Hyperlipidemia, mild 07/17/2015  . Environmental and seasonal allergies 06/09/2015  . Cardiac murmur 06/09/2015  . Idiopathic insomnia 06/09/2015  . Rheumatoid arthritis of hand ([place]) 06/09/2015    Prior to Admission medications   Medication Sig Start Date End Date Taking? Authorizing Provider  ALPRAZolam (XANAX) 0.25 MG tablet TAKE 1 TABLET BY MOUTH 2 TIMES DAILY Patient taking differently: TAKE 1 TABLET BY MOUTH 2 TIMES AS NEEDED 08/21/17  Yes Glean Hess, MD  Azelastine-Fluticasone [facility]) 137-50 MCG/ACT SUSP Place 1 spray into the nose 2 (two) times daily as needed. 06/18/13  Yes [provider]  naproxen sodium (ALEVE) 220 MG tablet Take 1 tablet by mouth 2 (two) times daily as needed.   Yes [provider]    No Known Allergies  Past Surgical History:  Procedure  Laterality Date  . NO PAST SURGERIES      Social History   Tobacco Use  . Smoking status: Never Smoker  . Smokeless tobacco: Never Used  Substance Use Topics  . Alcohol use: Yes    Alcohol/week: 1.2 oz    Types: 2 Standard drinks or equivalent per week    Comment: occasional  . Drug use: No     Medication list has been reviewed and updated.  PHQ 2/9 Scores 05/12/2017  PHQ - 2 Score 1    Physical Exam  Constitutional: She is oriented to person, place, and time. She appears well-developed. She has a sickly appearance. No distress.  HENT:  Head: Normocephalic and atraumatic.  Right Ear: Hearing, tympanic membrane and ear canal normal.  Left Ear: Hearing, tympanic membrane and ear canal normal.  Nose: Right sinus exhibits no maxillary sinus tenderness and no frontal sinus tenderness. Left sinus exhibits no maxillary sinus tenderness and no frontal sinus tenderness.  Mouth/Throat: No posterior oropharyngeal edema or posterior oropharyngeal erythema.  Eyes: Pupils are equal, round, and reactive to light.  Neck: Normal range of motion. Neck supple.  Cardiovascular: Normal rate, regular rhythm and normal heart sounds.  No murmur heard. Pulmonary/Chest: Effort normal. No respiratory distress. She has decreased breath sounds. She has no wheezes. She has no rhonchi. She has no rales.  Musculoskeletal: Normal range of motion.  Painful stiff 4th and 5th fingers on right Mild synovitis bilateral MCP and wrists  Lymphadenopathy:    She has no cervical adenopathy.  Neurological: She is alert  and oriented to person, place, and time.  Skin: Skin is warm and dry. No rash noted.  Psychiatric: She has a normal mood and affect. Her behavior is normal. Thought content normal.    BP 108/70   Pulse 96   Ht 5' 4"  (1.626 m)   Wt 134 lb (60.8 kg)   SpO2 99%   BMI 23.00 kg/m   Assessment and Plan: 1. Shortness of breath Rule out pleural effusion or atypical chest infection - CBC with  Differential/Platelet - DG Chest 2 View; Future  2. Rheumatoid arthritis involving both hands with positive rheumatoid factor ([place]) Constitutional sx likely contributed to by uncontrolled RA Encourage pt to follow up with Rheum and strongly consider DMARD therapy   No orders of the defined types were placed in this encounter.   Partially dictated using Editor, commissioning. Any errors are unintentional.  Halina Maidens, MD [facility]  11/02/2017

## 2017-11-03 LAB — CBC WITH DIFFERENTIAL/PLATELET
BASOS ABS: 0.1 10*3/uL (ref 0.0–0.2)
BASOS: 1 %
EOS (ABSOLUTE): 0.5 10*3/uL — AB (ref 0.0–0.4)
Eos: 7 %
Hematocrit: 35.3 % (ref 34.0–46.6)
Hemoglobin: 11 g/dL — ABNORMAL LOW (ref 11.1–15.9)
Immature Grans (Abs): 0 10*3/uL (ref 0.0–0.1)
Immature Granulocytes: 0 %
Lymphocytes Absolute: 1.4 10*3/uL (ref 0.7–3.1)
Lymphs: 23 %
MCH: 23.7 pg — AB (ref 26.6–33.0)
MCHC: 31.2 g/dL — ABNORMAL LOW (ref 31.5–35.7)
MCV: 76 fL — AB (ref 79–97)
MONOS ABS: 0.4 10*3/uL (ref 0.1–0.9)
Monocytes: 7 %
NEUTROS ABS: 3.9 10*3/uL (ref 1.4–7.0)
NEUTROS PCT: 62 %
PLATELETS: 500 10*3/uL — AB (ref 150–379)
RBC: 4.64 x10E6/uL (ref 3.77–5.28)
RDW: 18 % — AB (ref 12.3–15.4)
WBC: 6.3 10*3/uL (ref 3.4–10.8)

## 2017-11-30 ENCOUNTER — Telehealth: Payer: Self-pay

## 2017-11-30 NOTE — Telephone Encounter (Signed)
Called reporting Tick Bite and thinks head is not attached. I advised her to clean the area and expect itching in a few days. I also advised her to pull out head if she can see it and if not to leave it alone as it will be shedding off by skin as natural process. She saved Tick and marked on calendar. I went over symptoms to watch for like flu like symptoms and rash.

## 2017-12-23 ENCOUNTER — Other Ambulatory Visit: Payer: Self-pay | Admitting: Internal Medicine

## 2017-12-23 DIAGNOSIS — F419 Anxiety disorder, unspecified: Secondary | ICD-10-CM

## 2017-12-27 ENCOUNTER — Telehealth: Payer: Self-pay

## 2017-12-27 NOTE — Telephone Encounter (Signed)
Mammogram is tomorrow at 11:00 in [place] - in this building on the first floor. Xanax does not cause weight gain.

## 2017-12-27 NOTE — Telephone Encounter (Signed)
Patient notified

## 2017-12-27 NOTE — Telephone Encounter (Signed)
Patient called wanted to update Dr. Army Melia about her tick bite. She is doing well and not having any sx since calling on 11/30/2017. She also wanted to know if Xanax causes her to have weight gain or if it just her natural cause and needs to exercise more. Please advise on medication.  Patient was concern on where she is having her mammogram?

## 2017-12-28 ENCOUNTER — Ambulatory Visit
Admission: RE | Admit: 2017-12-28 | Discharge: 2017-12-28 | Disposition: A | Discharge status: Home / Self Care | Payer: Managed Care, Other (non HMO) | Source: Ambulatory Visit | Attending: Internal Medicine | Admitting: Internal Medicine

## 2017-12-28 DIAGNOSIS — Z1231 Encounter for screening mammogram for malignant neoplasm of breast: Secondary | ICD-10-CM | POA: Insufficient documentation

## 2017-12-28 DIAGNOSIS — Z1239 Encounter for other screening for malignant neoplasm of breast: Secondary | ICD-10-CM

## 2018-03-14 ENCOUNTER — Encounter: Payer: Self-pay | Admitting: Internal Medicine

## 2018-03-14 ENCOUNTER — Ambulatory Visit: Payer: Managed Care, Other (non HMO) | Admitting: Internal Medicine

## 2018-03-14 VITALS — BP 112/78 | HR 86 | Ht 64.0 in | Wt 132.0 lb

## 2018-03-14 DIAGNOSIS — N3001 Acute cystitis with hematuria: Secondary | ICD-10-CM

## 2018-03-14 DIAGNOSIS — M62830 Muscle spasm of back: Secondary | ICD-10-CM | POA: Diagnosis not present

## 2018-03-14 DIAGNOSIS — R109 Unspecified abdominal pain: Secondary | ICD-10-CM

## 2018-03-14 LAB — POCT URINALYSIS DIPSTICK
BILIRUBIN UA: NEGATIVE
GLUCOSE UA: NEGATIVE
Ketones, UA: NEGATIVE
Nitrite, UA: NEGATIVE
Protein, UA: NEGATIVE
SPEC GRAV UA: 1.015 (ref 1.010–1.025)
Urobilinogen, UA: 0.2 E.U./dL
pH, UA: 6 (ref 5.0–8.0)

## 2018-03-14 MED ORDER — NITROFURANTOIN MONOHYD MACRO 100 MG PO CAPS: 100.0000 mg | ORAL_CAPSULE | Freq: Two times a day (BID) | ORAL | 0 refills | Status: AC

## 2018-03-14 MED ORDER — METHOCARBAMOL 500 MG PO TABS: 500.0000 mg | ORAL_TABLET | Freq: Four times a day (QID) | ORAL | 0 refills | Status: AC | PRN

## 2018-03-14 NOTE — Progress Notes (Signed)
Date:  03/14/2018   Name:  Melinda House   DOB:  [birth year]   MRN:  [id number]   Chief Complaint: Back Pain (Started Saturday. Not sure how it happened but back pain hurts when walking, sitting, and bending. Hurts lower left back. Using icey/hot patch and not helping much. ) Back Pain  This is a new problem. The problem has been gradually improving since onset. The pain is present in the lumbar spine. The quality of the pain is described as aching. The pain does not radiate. The pain is moderate. The symptoms are aggravated by twisting, sitting and coughing. Stiffness is present in the morning. Associated symptoms include a fever. Pertinent negatives include no abdominal pain, chest pain, dysuria or headaches. She has tried ice, NSAIDs, walking and heat for the symptoms. The treatment provided mild relief.   Fever and fatigue - she had several days of low grade fever last week.  Felt tired and needed an afternoon nap. No sx over the past few days since the back pain started.   Review of Systems  Constitutional: Positive for fatigue (last week) and fever. Negative for chills, diaphoresis and unexpected weight change.  Respiratory: Negative for chest tightness and shortness of breath.   Cardiovascular: Negative for chest pain, palpitations and leg swelling.  Gastrointestinal: Negative for abdominal pain, constipation and diarrhea.  Genitourinary: Negative for dysuria, frequency, hematuria and vaginal bleeding.  Musculoskeletal: Positive for back pain.  Allergic/Immunologic: Negative for environmental allergies.  Neurological: Negative for dizziness and headaches.    Patient Active Problem List   Diagnosis Date Noted  . Hyperlipidemia, mild 07/17/2015  . Environmental and seasonal allergies 06/09/2015  . Cardiac murmur 06/09/2015  . Idiopathic insomnia 06/09/2015  . Rheumatoid arthritis of hand ([place]) 06/09/2015    No Known Allergies  Past Surgical History:  Procedure  Laterality Date  . NO PAST SURGERIES      Social History   Tobacco Use  . Smoking status: Never Smoker  . Smokeless tobacco: Never Used  Substance Use Topics  . Alcohol use: Yes    Alcohol/week: 2.0 standard drinks    Types: 2 Standard drinks or equivalent per week    Comment: occasional  . Drug use: No     Medication list has been reviewed and updated.  Current Meds  Medication Sig  . acetaminophen (TYLENOL) 325 MG tablet Take 650 mg by mouth every 6 (six) hours as needed.  . ALPRAZolam (XANAX) 0.25 MG tablet TAKE 1 TABLET BY MOUTH TWICE A DAY AS NEEDED  . Azelastine-Fluticasone (DYMISTA) 137-50 MCG/ACT SUSP Place 1 spray into the nose 2 (two) times daily as needed.  . naproxen sodium (ALEVE) 220 MG tablet Take 1 tablet by mouth 2 (two) times daily as needed.    PHQ 2/9 Scores 05/12/2017  PHQ - 2 Score 1    Physical Exam  Constitutional: She is oriented to person, place, and time. She appears well-developed. No distress.  HENT:  Head: Normocephalic and atraumatic.  Cardiovascular: Normal rate, regular rhythm and normal heart sounds.  Pulmonary/Chest: Effort normal and breath sounds normal. No respiratory distress.  Abdominal: There is no tenderness. There is no CVA tenderness.  Musculoskeletal:       Lumbar back: She exhibits decreased range of motion, tenderness and spasm.       Back:       Arms: Neurological: She is alert and oriented to person, place, and time.  SLR is negative bilaterally  Skin: Skin is warm  and dry. No rash noted.  Psychiatric: She has a normal mood and affect. Her behavior is normal. Thought content normal.  Nursing note and vitals reviewed.  Urine dipstick shows positive for WBC's and positive for RBC's.  Micro exam: 5  WBC's per HPF, 2 RBC's per HPF and 2+ bacteria.  BP 112/78 (BP Location: Right Arm, Patient Position: Sitting, Cuff Size: Normal)   Pulse 86   Ht 5' 4"  (1.626 m)   Wt 132 lb (59.9 kg)   SpO2 99%   BMI 22.66 kg/m    Assessment and Plan: 1. Flank pain, acute Possibly due to renal colic but sx not typical Continue Aleve - POCT urinalysis dipstick  2. Muscle spasm of back Add robaxin at HS and during the day if tolerated - methocarbamol (ROBAXIN) 500 MG tablet; Take 1 tablet (500 mg total) by mouth every 6 (six) hours as needed for muscle spasms.  Dispense: 40 tablet; Refill: 0  3. Acute cystitis with hematuria Increase fluids Follow up if fever/chills recur - nitrofurantoin, macrocrystal-monohydrate, (MACROBID) 100 MG capsule; Take 1 capsule (100 mg total) by mouth 2 (two) times daily for 5 days.  Dispense: 10 capsule; Refill: 0   Meds ordered this encounter  Medications  . methocarbamol (ROBAXIN) 500 MG tablet    Sig: Take 1 tablet (500 mg total) by mouth every 6 (six) hours as needed for muscle spasms.    Dispense:  40 tablet    Refill:  0  . nitrofurantoin, macrocrystal-monohydrate, (MACROBID) 100 MG capsule    Sig: Take 1 capsule (100 mg total) by mouth 2 (two) times daily for 5 days.    Dispense:  10 capsule    Refill:  0    Partially dictated using Editor, commissioning. Any errors are unintentional.  Halina Maidens, MD [facility]  03/14/2018

## 2018-03-14 NOTE — Patient Instructions (Signed)
Continue to use Aleve - up to 2 twice a day; Salonpas, heat or ice and take the muscle relaxant at bedtime daily and during the day as needed/tolerated.

## 2018-07-29 ENCOUNTER — Encounter: Payer: Self-pay | Admitting: Internal Medicine

## 2018-07-30 ENCOUNTER — Encounter: Payer: Self-pay | Admitting: Internal Medicine

## 2018-07-30 ENCOUNTER — Ambulatory Visit (INDEPENDENT_AMBULATORY_CARE_PROVIDER_SITE_OTHER): Payer: Managed Care, Other (non HMO) | Admitting: Internal Medicine

## 2018-07-30 VITALS — BP 114/78 | HR 80 | Ht 64.0 in | Wt 131.0 lb

## 2018-07-30 DIAGNOSIS — L659 Nonscarring hair loss, unspecified: Secondary | ICD-10-CM

## 2018-07-30 DIAGNOSIS — J3089 Other allergic rhinitis: Secondary | ICD-10-CM | POA: Diagnosis not present

## 2018-07-30 DIAGNOSIS — Z1231 Encounter for screening mammogram for malignant neoplasm of breast: Secondary | ICD-10-CM | POA: Diagnosis not present

## 2018-07-30 DIAGNOSIS — M05741 Rheumatoid arthritis with rheumatoid factor of right hand without organ or systems involvement: Secondary | ICD-10-CM

## 2018-07-30 DIAGNOSIS — M05742 Rheumatoid arthritis with rheumatoid factor of left hand without organ or systems involvement: Secondary | ICD-10-CM

## 2018-07-30 DIAGNOSIS — Z0000 Encounter for general adult medical examination without abnormal findings: Secondary | ICD-10-CM | POA: Diagnosis not present

## 2018-07-30 DIAGNOSIS — K5901 Slow transit constipation: Secondary | ICD-10-CM

## 2018-07-30 DIAGNOSIS — D649 Anemia, unspecified: Secondary | ICD-10-CM

## 2018-07-30 LAB — POCT URINALYSIS DIPSTICK
BILIRUBIN UA: NEGATIVE
GLUCOSE UA: NEGATIVE
KETONES UA: NEGATIVE
Leukocytes, UA: NEGATIVE
Nitrite, UA: NEGATIVE
Protein, UA: NEGATIVE
Spec Grav, UA: 1.01 (ref 1.010–1.025)
Urobilinogen, UA: 0.2 E.U./dL
pH, UA: 5 (ref 5.0–8.0)

## 2018-07-30 NOTE — Progress Notes (Signed)
Date:  07/30/2018   Name:  Melinda House   DOB:  July 25, 1975   MRN:  [id number]   Chief Complaint: Annual Exam (Breast Exam. Papsmear next physical.) Melinda House is a 43 y.o. female who presents today for her Complete Annual Exam. She feels well. She reports exercising regularly - walking. She reports she is sleeping poorly. Mammogram is due in June.  Pap smear is due in one year.  RA - still has sx in hands, wrists and shoulder.  She uses salonpas or Aleve.  She is still avoiding nightshade family foods.   Allergies - stable with no current issues  Insomnia - she has trouble falling asleep unless she is very tired.  She has trouble staying asleep as well.  Xanax helps her to go to sleep but she can not stay asleep.  She also has concerns about hair thinning - is taking a supplement with Biotin and several other herbal compounds.  She asks about Rogaine. She has only 2-3 BM per week - is not uncomfortable or bloated in between.  She drinks lots of water and has regular exercise.  She does not take laxatives in order to have BM. She does take iron plus a multivit with iron daily. She has mild chronic anemia and low MCV.  She is taking iron but has never had levels checked.  Menses are regular, moderate flow and unchanged.  Review of Systems  Constitutional: Negative for chills, fatigue and fever.  HENT: Negative for congestion, hearing loss, tinnitus, trouble swallowing and voice change.   Eyes: Negative for visual disturbance.  Respiratory: Negative for cough, chest tightness, shortness of breath and wheezing.   Cardiovascular: Negative for chest pain, palpitations and leg swelling.  Gastrointestinal: Positive for constipation. Negative for abdominal distention, abdominal pain, anal bleeding, diarrhea and vomiting.  Endocrine: Negative for polydipsia and polyuria.  Genitourinary: Negative for dysuria, frequency, genital sores, menstrual problem, vaginal bleeding and vaginal  discharge.  Musculoskeletal: Positive for arthralgias and joint swelling. Negative for gait problem.  Skin: Negative for color change and rash.  Allergic/Immunologic: Positive for environmental allergies.  Neurological: Negative for dizziness, tremors, light-headedness and headaches.  Hematological: Negative for adenopathy. Does not bruise/bleed easily.  Psychiatric/Behavioral: Positive for sleep disturbance. Negative for dysphoric mood. The patient is not nervous/anxious.     Patient Active Problem List   Diagnosis Date Noted  . Hyperlipidemia, mild 07/17/2015  . Environmental and seasonal allergies 06/09/2015  . Cardiac murmur 06/09/2015  . Idiopathic insomnia 06/09/2015  . Rheumatoid arthritis of hand ([place]) 06/09/2015    No Known Allergies  Past Surgical History:  Procedure Laterality Date  . NO PAST SURGERIES      Social History   Tobacco Use  . Smoking status: Never Smoker  . Smokeless tobacco: Never Used  Substance Use Topics  . Alcohol use: Yes    Alcohol/week: 2.0 standard drinks    Types: 2 Standard drinks or equivalent per week    Comment: occasional  . Drug use: No     Medication list has been reviewed and updated.  Current Meds  Medication Sig  . acetaminophen (TYLENOL) 325 MG tablet Take 650 mg by mouth every 6 (six) hours as needed.  . ALPRAZolam (XANAX) 0.25 MG tablet TAKE 1 TABLET BY MOUTH TWICE A DAY AS NEEDED  . Azelastine-Fluticasone (DYMISTA) 137-50 MCG/ACT SUSP Place 1 spray into the nose 2 (two) times daily as needed.  . naproxen sodium (ALEVE) 220 MG tablet Take 1  tablet by mouth 2 (two) times daily as needed.    PHQ 2/9 Scores 07/30/2018 05/12/2017  PHQ - 2 Score 0 1   Wt Readings from Last 3 Encounters:  07/30/18 131 lb (59.4 kg)  03/14/18 132 lb (59.9 kg)  11/02/17 134 lb (60.8 kg)    Physical Exam Vitals signs and nursing note reviewed.  Constitutional:      General: She is not in acute distress.    Appearance: She is  well-developed.  HENT:     Head: Normocephalic and atraumatic.     Right Ear: Tympanic membrane and ear canal normal.     Left Ear: Tympanic membrane and ear canal normal.     Nose:     Right Sinus: No maxillary sinus tenderness.     Left Sinus: No maxillary sinus tenderness.     Mouth/Throat:     Pharynx: Uvula midline.  Eyes:     General: No scleral icterus.       Right eye: No discharge.        Left eye: No discharge.     Conjunctiva/sclera: Conjunctivae normal.  Neck:     Musculoskeletal: Normal range of motion. No erythema.     Thyroid: No thyromegaly.     Vascular: No carotid bruit.  Cardiovascular:     Rate and Rhythm: Normal rate and regular rhythm.     Pulses: Normal pulses.     Heart sounds: Normal heart sounds.  Pulmonary:     Effort: Pulmonary effort is normal. No respiratory distress.     Breath sounds: No wheezing.  Chest:     Breasts:        Right: No mass, nipple discharge, skin change or tenderness.        Left: No mass, nipple discharge, skin change or tenderness.  Abdominal:     General: Bowel sounds are normal.     Palpations: Abdomen is soft.     Tenderness: There is no abdominal tenderness.  Musculoskeletal: Normal range of motion.       Arms:  Lymphadenopathy:     Cervical: No cervical adenopathy.  Skin:    General: Skin is warm and dry.     Findings: No rash.     Comments: Slight thinning of hair anterior scalp  Neurological:     Mental Status: She is alert and oriented to person, place, and time.     Cranial Nerves: No cranial nerve deficit.     Sensory: No sensory deficit.     Deep Tendon Reflexes: Reflexes are normal and symmetric.  Psychiatric:        Speech: Speech normal.        Behavior: Behavior normal.        Thought Content: Thought content normal.     BP 114/78   Pulse 80   Ht 5' 4"  (1.626 m)   Wt 131 lb (59.4 kg)   LMP 07/28/2018 (Exact Date)   SpO2 99%   BMI 22.49 kg/m   Assessment and Plan: 1. Annual physical  exam Normal exam Continue healthy diet, regular exercise - Comprehensive metabolic panel - Lipid panel - TSH - POCT urinalysis dipstick  2. Encounter for screening mammogram for breast cancer - MM 3D SCREEN BREAST BILATERAL; Future  3. Rheumatoid arthritis involving both hands with positive rheumatoid factor ([facility]) Continue Aleve, topical agents Pt remains resistant to Rheumatology evaluation  4. Environmental and seasonal allergies controlled  5. Mild chronic anemia Check labs and advise - CBC with  Differential/Platelet - Iron, TIBC and Ferritin Panel  6. Slow transit constipation Continue high fiber diet, water, exercise Iron supplement may be contributing No specific treatment is needed  7. Thinning hair May try Rogaine topical Continue Biotin supplement   Partially dictated using Editor, commissioning. Any errors are unintentional.  Halina Maidens, MD [facility]  07/30/2018

## 2018-07-31 LAB — COMPREHENSIVE METABOLIC PANEL
ALK PHOS: 73 IU/L (ref 39–117)
ALT: 11 IU/L (ref 0–32)
AST: 14 IU/L (ref 0–40)
Albumin/Globulin Ratio: 1.8 (ref 1.2–2.2)
Albumin: 4.9 g/dL — ABNORMAL HIGH (ref 3.8–4.8)
BUN/Creatinine Ratio: 10 (ref 9–23)
BUN: 8 mg/dL (ref 6–24)
Bilirubin Total: 0.5 mg/dL (ref 0.0–1.2)
CO2: 22 mmol/L (ref 20–29)
CREATININE: 0.82 mg/dL (ref 0.57–1.00)
Calcium: 10 mg/dL (ref 8.7–10.2)
Chloride: 97 mmol/L (ref 96–106)
GFR calc Af Amer: 102 mL/min/{1.73_m2} (ref >59)
GFR calc non Af Amer: 89 mL/min/{1.73_m2} (ref >59)
GLOBULIN, TOTAL: 2.8 g/dL (ref 1.5–4.5)
GLUCOSE: 90 mg/dL (ref 65–99)
Potassium: 4.9 mmol/L (ref 3.5–5.2)
SODIUM: 137 mmol/L (ref 134–144)
Total Protein: 7.7 g/dL (ref 6.0–8.5)

## 2018-07-31 LAB — CBC WITH DIFFERENTIAL/PLATELET
BASOS: 1 %
Basophils Absolute: 0.1 10*3/uL (ref 0.0–0.2)
EOS (ABSOLUTE): 0.3 10*3/uL (ref 0.0–0.4)
EOS: 4 %
Hematocrit: 41.4 % (ref 34.0–46.6)
Hemoglobin: 13.9 g/dL (ref 11.1–15.9)
IMMATURE GRANS (ABS): 0 10*3/uL (ref 0.0–0.1)
IMMATURE GRANULOCYTES: 0 %
Lymphocytes Absolute: 1.3 10*3/uL (ref 0.7–3.1)
Lymphs: 18 %
MCH: 28 pg (ref 26.6–33.0)
MCHC: 33.6 g/dL (ref 31.5–35.7)
MCV: 84 fL (ref 79–97)
MONOCYTES: 7 %
MONOS ABS: 0.5 10*3/uL (ref 0.1–0.9)
NEUTROS PCT: 70 %
Neutrophils Absolute: 5 10*3/uL (ref 1.4–7.0)
PLATELETS: 470 10*3/uL — AB (ref 150–450)
RBC: 4.96 x10E6/uL (ref 3.77–5.28)
RDW: 13.6 % (ref 11.7–15.4)
WBC: 7.2 10*3/uL (ref 3.4–10.8)

## 2018-07-31 LAB — IRON,TIBC AND FERRITIN PANEL
FERRITIN: 35 ng/mL (ref 15–150)
IRON: 67 ug/dL (ref 27–159)
Iron Saturation: 19 % (ref 15–55)
Total Iron Binding Capacity: 352 ug/dL (ref 250–450)
UIBC: 285 ug/dL (ref 131–425)

## 2018-07-31 LAB — LIPID PANEL
CHOLESTEROL TOTAL: 206 mg/dL — AB (ref 100–199)
Chol/HDL Ratio: 4.5 ratio — ABNORMAL HIGH (ref 0.0–4.4)
HDL: 46 mg/dL (ref >39)
LDL CALC: 140 mg/dL — AB (ref 0–99)
TRIGLYCERIDES: 98 mg/dL (ref 0–149)
VLDL Cholesterol Cal: 20 mg/dL (ref 5–40)

## 2018-07-31 LAB — TSH: TSH: 2.44 u[IU]/mL (ref 0.450–4.500)

## 2018-08-21 ENCOUNTER — Other Ambulatory Visit: Payer: Self-pay | Admitting: Internal Medicine

## 2018-08-21 DIAGNOSIS — F419 Anxiety disorder, unspecified: Secondary | ICD-10-CM

## 2018-08-28 ENCOUNTER — Other Ambulatory Visit: Payer: Self-pay

## 2018-08-28 DIAGNOSIS — K5901 Slow transit constipation: Secondary | ICD-10-CM

## 2018-09-07 ENCOUNTER — Telehealth: Payer: Self-pay

## 2018-09-07 NOTE — Telephone Encounter (Signed)
Pt called leaving Vm about GI referral. Melinda House she has not heard anything about scheduling an appt.  Called patient back and left VM informing referral was sent the day after placed. Told her she can call [facility] and schedule an appt. Gave her number on VM.   Told her to call back with any questions.

## 2018-09-10 ENCOUNTER — Telehealth: Payer: Self-pay

## 2018-09-10 NOTE — Telephone Encounter (Signed)
Patient called saying she was charged extra for her recent physical and didn't understand why. Requested a call back.  Called patient and was unable to reach her. Left Vm informing that she discuss hair thinning and insomnia. This is considered a discussion outside of her physical so that is why she was billed for an OV also. Told her even tho she declined medicine for Insomnia it is still considered a discussion with the physician.  Told her to call if she has any other questions about this.

## 2018-10-02 ENCOUNTER — Other Ambulatory Visit: Payer: Self-pay | Admitting: Internal Medicine

## 2018-10-02 DIAGNOSIS — F419 Anxiety disorder, unspecified: Secondary | ICD-10-CM

## 2018-10-03 ENCOUNTER — Other Ambulatory Visit: Payer: Self-pay | Admitting: Internal Medicine

## 2018-10-03 ENCOUNTER — Telehealth: Payer: Self-pay

## 2018-10-03 DIAGNOSIS — F5101 Primary insomnia: Secondary | ICD-10-CM

## 2018-10-03 DIAGNOSIS — F419 Anxiety disorder, unspecified: Secondary | ICD-10-CM

## 2018-10-03 MED ORDER — ALPRAZOLAM 0.25 MG PO TABS: 0.2500 mg | ORAL_TABLET | Freq: Every evening | ORAL | 0 refills | Status: DC | PRN

## 2018-10-03 NOTE — Telephone Encounter (Signed)
Pt called saying she requested a refill through her pharmacy for Xanax and it was denied. Was told by pharmacist to call the office.   Spoke with Dr Army Melia and informed pt she should only be taking as needed and 30 tabs should last her 90 days. She said she had 12 tabs left she just didn't want to run out during this virus.   Dr B verbalized understanding and sent in patients medication> Patient informed.

## 2018-12-18 ENCOUNTER — Other Ambulatory Visit: Payer: Self-pay | Admitting: Internal Medicine

## 2018-12-18 DIAGNOSIS — F5101 Primary insomnia: Secondary | ICD-10-CM

## 2019-01-03 ENCOUNTER — Other Ambulatory Visit: Payer: Self-pay

## 2019-01-03 ENCOUNTER — Ambulatory Visit
Admission: RE | Admit: 2019-01-03 | Discharge: 2019-01-03 | Disposition: A | Discharge status: Home / Self Care | Payer: Managed Care, Other (non HMO) | Source: Ambulatory Visit | Attending: Internal Medicine | Admitting: Internal Medicine

## 2019-01-03 DIAGNOSIS — Z1231 Encounter for screening mammogram for malignant neoplasm of breast: Secondary | ICD-10-CM | POA: Insufficient documentation

## 2019-01-14 ENCOUNTER — Encounter: Payer: Self-pay | Admitting: Internal Medicine

## 2019-02-09 ENCOUNTER — Other Ambulatory Visit: Payer: Self-pay | Admitting: Internal Medicine

## 2019-02-09 DIAGNOSIS — F5101 Primary insomnia: Secondary | ICD-10-CM

## 2019-02-19 ENCOUNTER — Telehealth: Payer: Self-pay

## 2019-02-19 ENCOUNTER — Other Ambulatory Visit: Payer: Self-pay | Admitting: Internal Medicine

## 2019-02-19 DIAGNOSIS — F5101 Primary insomnia: Secondary | ICD-10-CM

## 2019-02-19 MED ORDER — ALPRAZOLAM 0.25 MG PO TABS: 0.2500 mg | ORAL_TABLET | Freq: Every evening | ORAL | 5 refills | Status: AC | PRN

## 2019-02-19 NOTE — Telephone Encounter (Signed)
Patient requesting RF for Xanax for [facility]. Please advise.

## 2019-06-09 IMAGING — MG MM DIGITAL SCREENING BILAT W/ TOMO W/ CAD
8 series · 8 of 24 positions shown · non-contrast
Comparison: Previous exam(s).

CLINICAL DATA: Screening.

EXAM:
DIGITAL SCREENING BILATERAL MAMMOGRAM WITH TOMO AND CAD

[L MLO synth-2D]
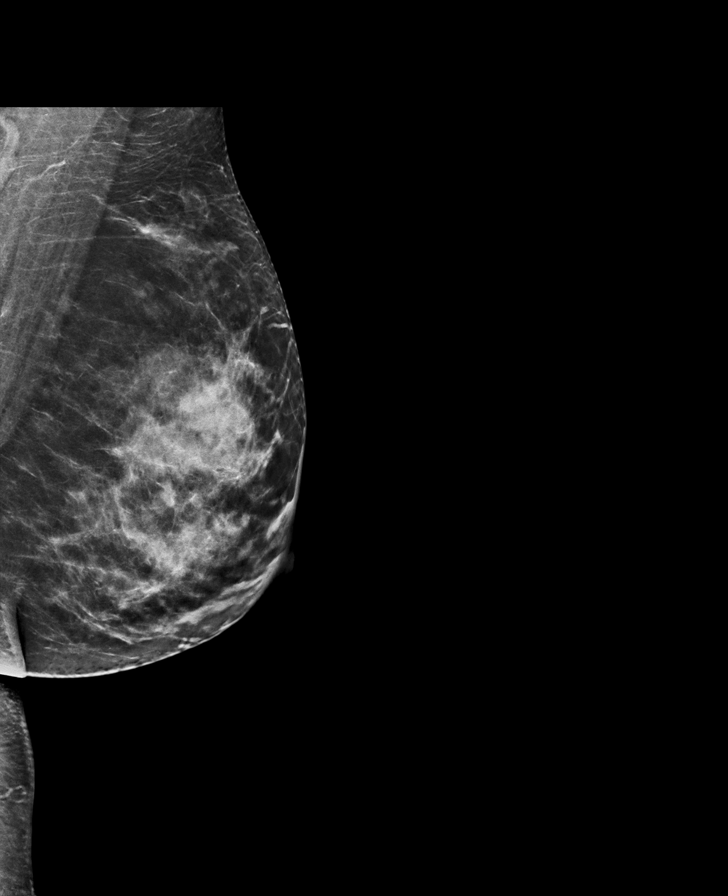

[L CC synth-2D]
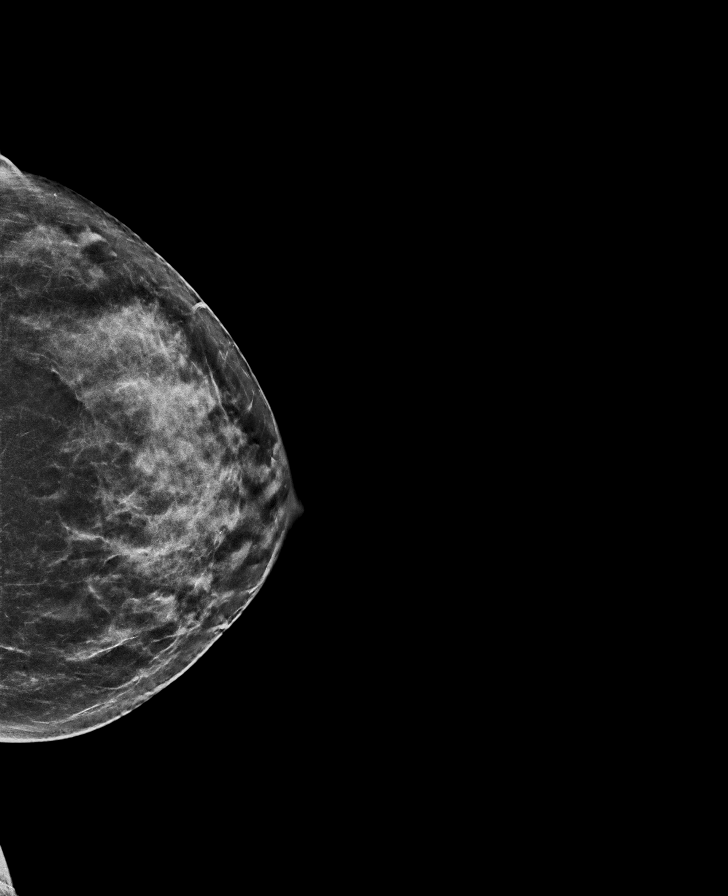

[R CC synth-2D]
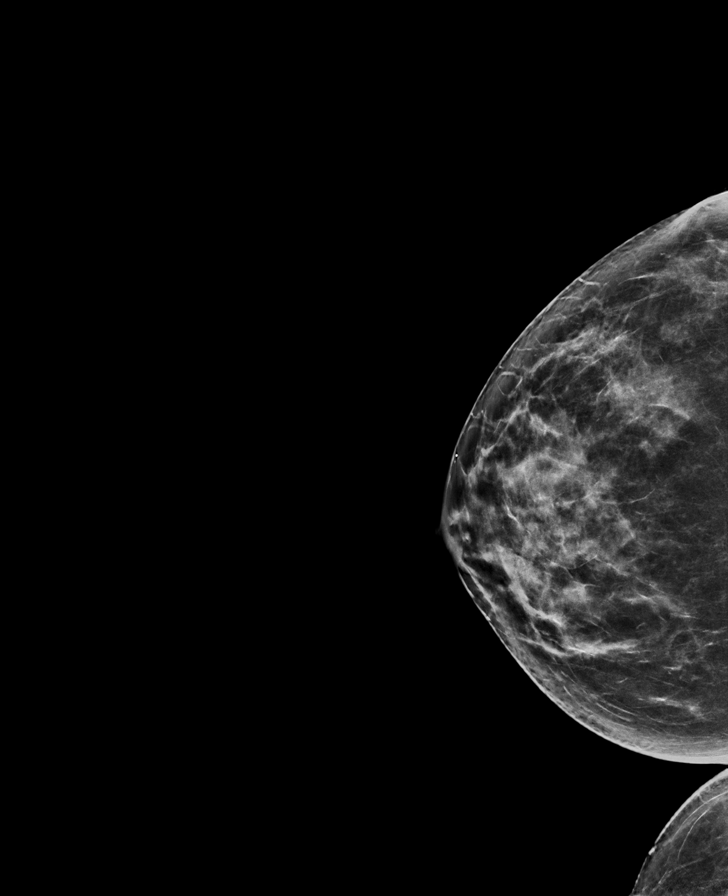

[R MLO synth-2D]
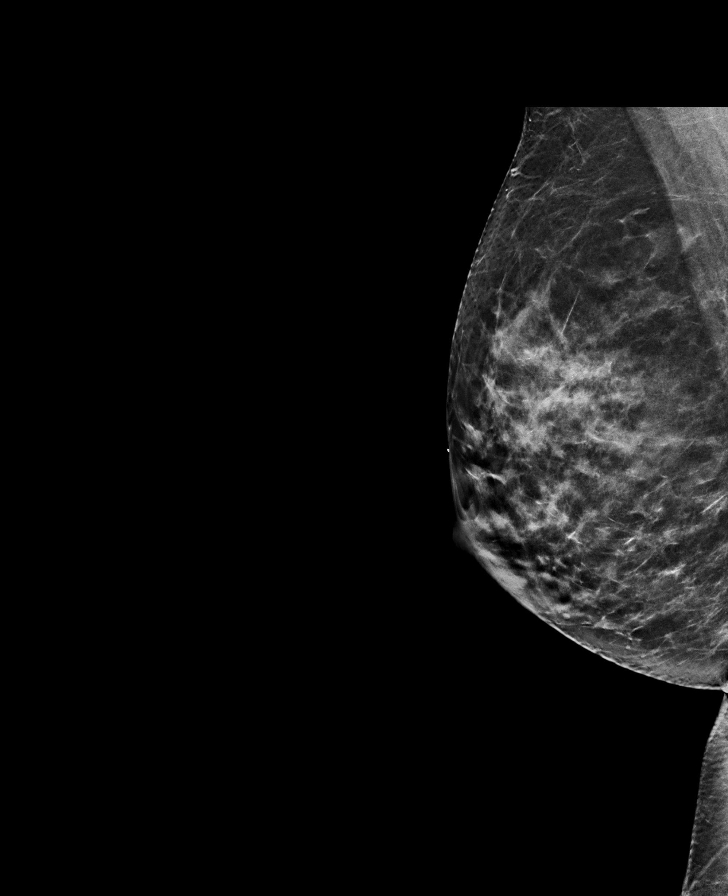

[L MLO tomo · tomo slice 36/71.0]
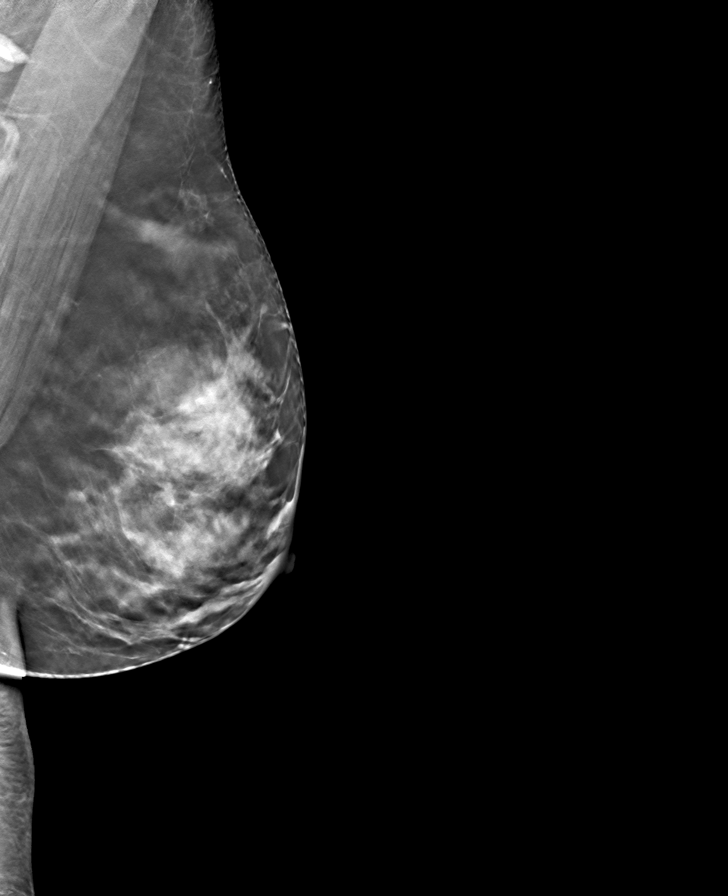

[R CC tomo · tomo slice 37/72.0]
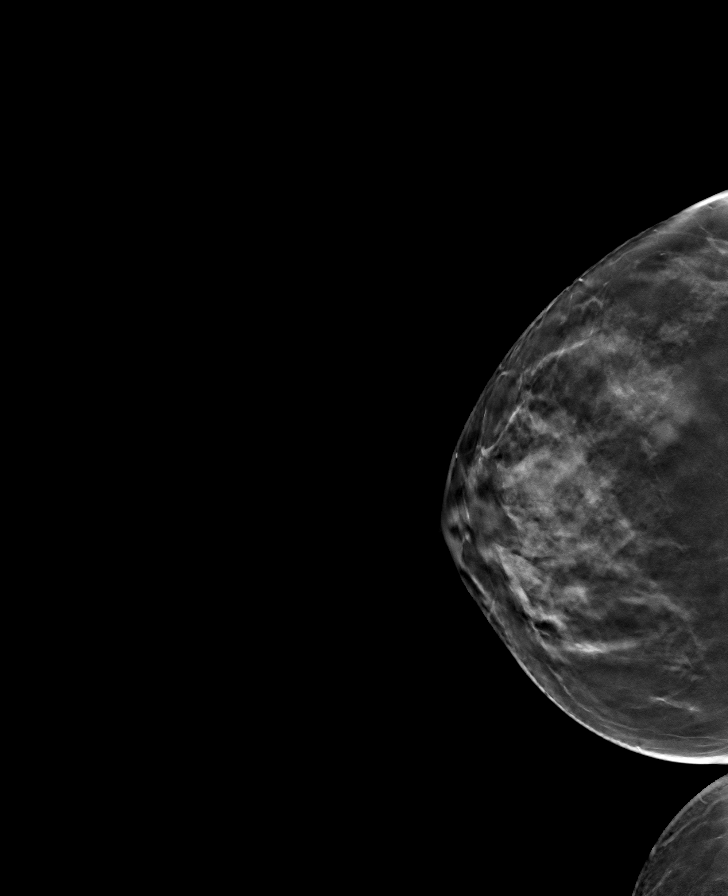

[R MLO tomo · tomo slice 33/65.0]
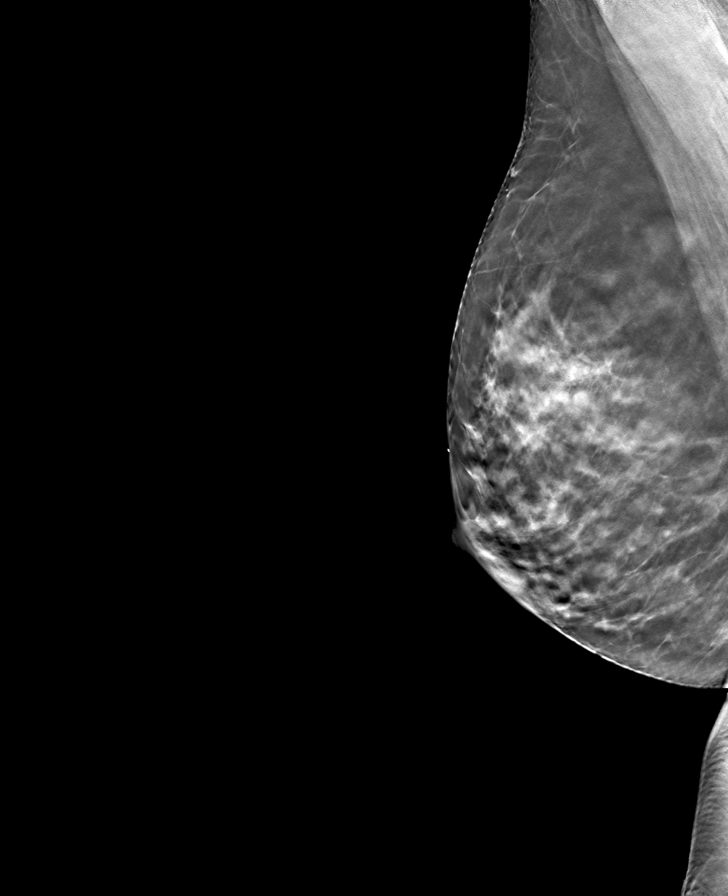

[L CC tomo · tomo slice 33/64.0]
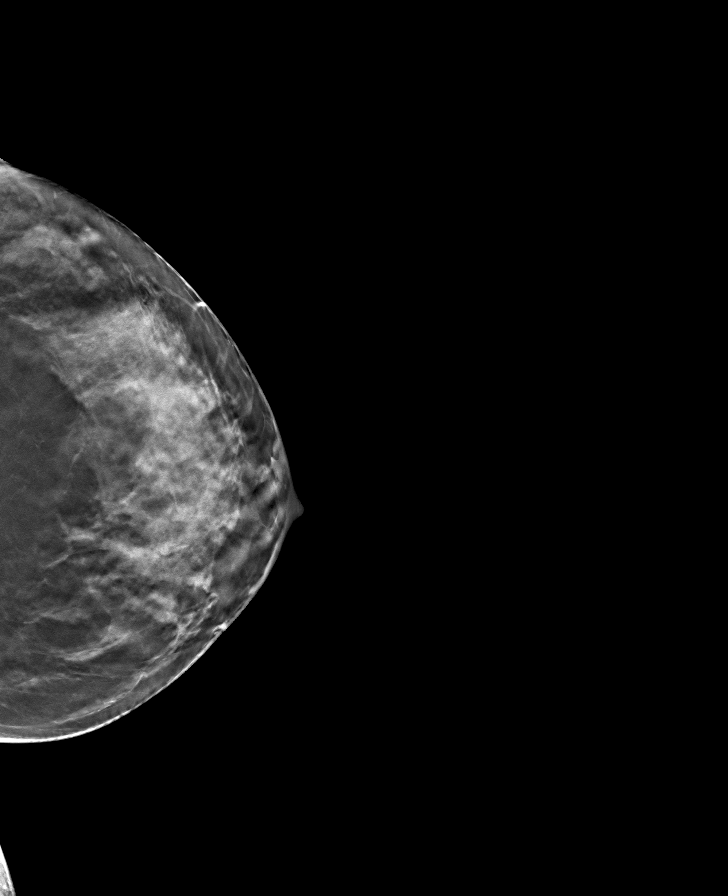

[8 of 24 positions shown; findings below may reference images not displayed]

ACR Breast Density Category c: The breast tissue is heterogeneously
dense, which may obscure small masses.
FINDINGS: There are no findings suspicious for malignancy. Images were
processed with CAD.
IMPRESSION: No mammographic evidence of malignancy. A result letter of this
screening mammogram will be mailed directly to the patient.

RECOMMENDATION:
Screening mammogram in one year. (Code:FT-U-LHB)

BI-RADS CATEGORY  1: Negative.

## 2019-08-02 ENCOUNTER — Encounter: Payer: Managed Care, Other (non HMO) | Admitting: Internal Medicine

## 2020-11-30 ENCOUNTER — Other Ambulatory Visit: Payer: Self-pay | Admitting: Family Medicine

## 2020-11-30 DIAGNOSIS — Z1231 Encounter for screening mammogram for malignant neoplasm of breast: Secondary | ICD-10-CM

## 2020-12-17 ENCOUNTER — Ambulatory Visit
Admission: RE | Admit: 2020-12-17 | Discharge: 2020-12-17 | Disposition: A | Discharge status: Home / Self Care | Payer: Managed Care, Other (non HMO) | Source: Ambulatory Visit | Attending: Family Medicine | Admitting: Family Medicine

## 2020-12-17 ENCOUNTER — Other Ambulatory Visit: Payer: Self-pay

## 2020-12-17 DIAGNOSIS — Z1231 Encounter for screening mammogram for malignant neoplasm of breast: Secondary | ICD-10-CM | POA: Diagnosis not present

## 2021-01-19 ENCOUNTER — Ambulatory Visit: Payer: Managed Care, Other (non HMO) | Admitting: Dermatology

## 2022-01-13 ENCOUNTER — Ambulatory Visit (INDEPENDENT_AMBULATORY_CARE_PROVIDER_SITE_OTHER): Payer: Managed Care, Other (non HMO) | Admitting: Dermatology

## 2022-01-13 DIAGNOSIS — L305 Pityriasis alba: Secondary | ICD-10-CM | POA: Diagnosis not present

## 2022-01-13 DIAGNOSIS — L309 Dermatitis, unspecified: Secondary | ICD-10-CM

## 2022-01-13 MED ORDER — MOMETASONE FUROATE 0.1 % EX CREA: 1.0000 | TOPICAL_CREAM | CUTANEOUS | 1 refills | Status: AC

## 2022-01-13 MED ORDER — PIMECROLIMUS 1 % EX CREA: TOPICAL_CREAM | CUTANEOUS | 2 refills | Status: AC

## 2022-01-13 NOTE — Progress Notes (Signed)
   New Patient Visit  Subjective  Melinda House is a 46 y.o. female who presents for the following: Skin Discoloration (Face, neck, arms, patch on L calf, started in winter but worsened 09/2021, spf, pt on Plaquenil for RA, pt tries to limit sun exposure, some of the areas on face itchy, no treatment, pt will d/c plaquenil if she knows she is going to be out in sun, possible hx of eczema).   The following portions of the chart were reviewed this encounter and updated as appropriate:       Review of Systems:  No other skin or systemic complaints except as noted in HPI or Assessment and Plan.  Objective  Well appearing patient in no apparent distress; mood and affect are within normal limits.  A focused examination was performed including face, arms, legs, ears, scalp. Relevant physical exam findings are noted in the Assessment and Plan.  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 Condition is bothersome/symptomatic for patient. Currently flared.   Atopic dermatitis (eczema) is a chronic, relapsing, pruritic condition that can significantly affect quality of life. It is often associated with allergic rhinitis and/or asthma and can require treatment with topical medications, phototherapy, or in severe cases biologic injectable medication (Dupixent; Adbry) or Oral JAK inhibitors.   Start Elidel cream bid to aas face and body Start Mometasone cr qd/bid up to 4 weeks to aas body/ears Recommend SPF 30+ with zinc to face daily D/C St. Octavio Manns exfoliating cleanser Recommend mild cleanser for face, samples of LaRoch Posey, Neutrogena Recommend moisturizer qd samples of Aveeno given  Recommend mild soap and  moisturizing cream 1-2 times daily.  Gentle skin care handout provided.    Discussed topical hair removal to upper lip, Pt uses Darene Lamer, recommend pt using the Elidel cr after using topical hair removal   Topical steroids (such as triamcinolone, fluocinolone, fluocinonide, mometasone, clobetasol, halobetasol, betamethasone, hydrocortisone) can cause thinning and lightening of the skin if they are used for too long in the same area. Your physician has selected the right strength medicine for your problem and area affected on the body. Please use your medication only as directed by your physician to prevent side effects.    pimecrolimus (ELIDEL) 1 % cream - Head - Anterior (Face) Apply topically as directed. Qd to Bid to face until clear, then prn flares of itchy rash  mometasone (ELOCON) 0.1 % cream - Head - Anterior (Face) Apply 1 Application topically as directed. Qd to bid aa itchy rash on body, arms, legs, ears for up to 4 weeks   Return in about 2 months (around 03/16/2022) for Dermatitis/Pityriasis Alba.  I, Ardis Rowan, RMA, am acting as scribe for Willeen Niece, MD .  Documentation: I have reviewed the above documentation for accuracy and completeness, and I agree with the above.  Willeen Niece MD

## 2022-01-13 NOTE — Patient Instructions (Addendum)
Dry Skin Care  What causes dry skin?  Dry skin is common and results from inadequate moisture in the outer skin layers. Dry skin usually results from the excessive loss of moisture from the skin surface. This occurs due to two major factors: Normally the skin's oil glands deposit a layer of oil on the skin's surface. This layer of oil prevents the loss of moisture from the skin. Exposure to soaps, cleaners, solvents, and disinfectants removes this oily film, allowing water to escape. Water loss from the skin increases when the humidity is low. During winter months we spend a lot of time indoors where the air is heated. Heated air has very low humidity. This also contributes to dry skin.  A tendency for dry skin may accompany such disorders as eczema. Also, as people age, the number of functioning oil glands decreases, and the tendency toward dry skin can be a sensation of skin tightness when emerging from the shower.  How do I manage dry skin?  Humidify your environment. This can be accomplished by using a humidifier in your bedroom at night during winter months. Bathing can actually put moisture back into your skin if done right. Take the following steps while bathing to sooth dry skin: Avoid hot water, which only dries the skin and makes itching worse. Use warm water. Avoid washcloths or extensive rubbing or scrubbing. Use mild soaps like unscented Dove, Oil of Olay, Cetaphil, Basis, or CeraVe. If you take baths rather than showers, rinse off soap residue with clean water before getting out of tub. Once out of the shower/tub, pat dry gently with a soft towel. Leave your skin damp. While still damp, apply any medicated ointment/cream you were prescribed to the affected areas. After you apply your medicated ointment/cream, then apply your moisturizer to your whole body.This is the most important step in dry skin care. If this is omitted, your skin will continue to be dry. The choice of  moisturizer is also very important. In general, lotion will not provider enough moisture to severely dry skin because it is water based. You should use an ointment or cream. Moisturizers should also be unscented. Good choices include Vaseline (plain petrolatum), Aquaphor, Cetaphil, CeraVe, Vanicream, DML Forte, Aveeno moisture, or Eucerin Cream. Bath oils can be helpful, but do not replace the application of moisturizer after the bath. In addition, they make the tub slippery causing an increased risk for falls. Therefore, we do not recommend their use.   Dermatitis with Pityriasis Alba    Due to recent changes in healthcare laws, you may see results of your pathology and/or laboratory studies on MyChart before the doctors have had a chance to review them. We understand that in some cases there may be results that are confusing or concerning to you. Please understand that not all results are received at the same time and often the doctors may need to interpret multiple results in order to provide you with the best plan of care or course of treatment. Therefore, we ask that you please give [place] 2 business days to thoroughly review all your results before contacting the office for clarification. Should we see a critical lab result, you will be contacted sooner.   If You Need Anything After Your Visit  If you have any questions or concerns for your doctor, please call our main line at [phone] and press option 4 to reach your doctor's medical assistant. If no one answers, please leave a voicemail as directed and we will return  your call as soon as possible. Messages left after 4 pm will be answered the following business day.   You may also send [place] a message via MyChart. We typically respond to MyChart messages within 1-2 business days.  For prescription refills, please ask your pharmacy to contact our office. Our fax number is [phone].  If you have an urgent issue when the clinic is closed  that cannot wait until the next business day, you can page your doctor at the number below.    Please note that while we do our best to be available for urgent issues outside of office hours, we are not available 24/7.   If you have an urgent issue and are unable to reach [place], you may choose to seek medical care at your doctor's office, retail clinic, urgent care center, or emergency room.  If you have a medical emergency, please immediately call 911 or go to the emergency department.  Pager Numbers  - Dr. Gwen Pounds: [phone]  - Dr. Neale Burly: [phone]  - Dr. Roseanne Reno: [phone]  In the event of inclement weather, please call our main line at [phone] for an update on the status of any delays or closures.  Dermatology Medication Tips: Please keep the boxes that topical medications come in in order to help keep track of the instructions about where and how to use these. Pharmacies typically print the medication instructions only on the boxes and not directly on the medication tubes.   If your medication is too expensive, please contact our office at [phone] option 4 or send [place] a message through MyChart.   We are unable to tell what your co-pay for medications will be in advance as this is different depending on your insurance coverage. However, we may be able to find a substitute medication at lower cost or fill out paperwork to get insurance to cover a needed medication.   If a prior authorization is required to get your medication covered by your insurance company, please allow [place] 1-2 business days to complete this process.  Drug prices often vary depending on where the prescription is filled and some pharmacies may offer cheaper prices.  The website www.goodrx.com contains coupons for medications through different pharmacies. The prices here do not account for what the cost may be with help from insurance (it may be cheaper with your insurance), but the website can give you  the price if you did not use any insurance.  - You can print the associated coupon and take it with your prescription to the pharmacy.  - You may also stop by our office during regular business hours and pick up a GoodRx coupon card.  - If you need your prescription sent electronically to a different pharmacy, notify our office through [facility] or by phone at [phone] option 4.     Si Usted Necesita Algo Despus de Su Visita  Tambin puede enviarnos un mensaje a travs de Clinical cytogeneticist. Por lo general respondemos a los mensajes de MyChart en el transcurso de 1 a 2 das hbiles.  Para renovar recetas, por favor pida a su farmacia que se ponga en contacto con nuestra oficina. Annie Sable de fax es [place] [phone].  Si tiene un asunto urgente cuando la clnica est cerrada y que no puede esperar hasta el siguiente da hbil, puede llamar/localizar a su doctor(a) al nmero que aparece a continuacin.   Por favor, tenga en cuenta que aunque hacemos todo lo posible para estar disponibles para asuntos urgentes  fuera del horario de oficina, no estamos disponibles las 24 horas del da, los 7 [address]  [address] de la [place].   Si tiene un problema urgente y no puede comunicarse con nosotros, puede optar por buscar atencin mdica  en el consultorio de su doctor(a), en una clnica privada, en un centro de atencin urgente o en una sala de emergencias.  Si tiene Engineer, drilling, por favor llame inmediatamente al 911 o vaya a la sala de emergencias.  Nmeros de bper  - Dr. Gwen Pounds: [phone]  - Dra. Moye: [phone]  - Dra. Roseanne Reno: [phone]  En caso de inclemencias del [place], por favor llame a Lacy Duverney principal al [phone] para una actualizacin sobre el [place] de cualquier retraso o cierre.  Consejos para la medicacin en dermatologa: Por favor, guarde las cajas en las que vienen los medicamentos de uso tpico para ayudarle a seguir las instrucciones sobre dnde y  cmo usarlos. Las farmacias generalmente imprimen las instrucciones del medicamento slo en las cajas y no directamente en los tubos del [place].   Si su medicamento es muy caro, por favor, pngase en contacto con Rolm Gala llamando al [phone] y presione la opcin 4 o envenos un mensaje a travs de Clinical cytogeneticist.   No podemos decirle cul ser su copago por los medicamentos por adelantado ya que esto es diferente dependiendo de la cobertura de su seguro. Sin embargo, es posible que podamos encontrar un medicamento sustituto a Audiological scientist un formulario para que el seguro cubra el medicamento que se considera necesario.   Si se requiere una autorizacin previa para que su compaa de seguros [place] su medicamento, por favor permtanos de 1 a 2 das hbiles para completar [address]  Los precios de los medicamentos varan con frecuencia dependiendo del Environmental consultant de dnde se surte la receta y alguna farmacias pueden ofrecer precios ms baratos.  El sitio web www.goodrx.com tiene cupones para medicamentos de Health and safety inspector. Los precios aqu no tienen en cuenta lo que podra costar con la ayuda del seguro (puede ser ms barato con su seguro), pero el sitio web puede darle el precio si no utiliz Tourist information centre manager.  - Puede imprimir el cupn correspondiente y llevarlo con su receta a la farmacia.  - Tambin puede pasar por nuestra oficina durante el horario de atencin regular y Education officer, museum una tarjeta de cupones de GoodRx.  - Si necesita que su receta se enve electrnicamente a una farmacia diferente, informe a nuestra oficina a travs de MyChart de [place] o por telfono llamando al [phone] y presione la opcin 4.

## 2022-01-14 ENCOUNTER — Encounter (INDEPENDENT_AMBULATORY_CARE_PROVIDER_SITE_OTHER): Payer: Self-pay

## 2022-03-22 ENCOUNTER — Ambulatory Visit (INDEPENDENT_AMBULATORY_CARE_PROVIDER_SITE_OTHER): Payer: Managed Care, Other (non HMO) | Admitting: Dermatology

## 2022-03-22 DIAGNOSIS — L305 Pityriasis alba: Secondary | ICD-10-CM

## 2022-03-22 DIAGNOSIS — L309 Dermatitis, unspecified: Secondary | ICD-10-CM | POA: Diagnosis not present

## 2022-03-22 DIAGNOSIS — L810 Postinflammatory hyperpigmentation: Secondary | ICD-10-CM

## 2022-03-22 NOTE — Progress Notes (Signed)
   Follow-Up Visit   Subjective  Melinda House is a 46 y.o. female who presents for the following: Dermatitis (2 month follow up). Overall she is improving and has less itching.  Mometasone is too greasy on skin and Elidel causes tingling around eyes, even when she uses it elsewhere on the face.  The following portions of the chart were reviewed this encounter and updated as appropriate:      Review of Systems: No other skin or systemic complaints except as noted in HPI or Assessment and Plan.   Objective  Well appearing patient in no apparent distress; mood and affect are within normal limits.  A focused examination was performed including arms, face. Relevant physical exam findings are noted in the Assessment and Plan.  Head - Anterior (Face) Hyperpigmented patches right > left arm  Hypopigmentation around cheeks and nose.    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 Chronic and persistent condition with duration or expected duration over one year. Condition is bothersome/symptomatic for patient. Improving but not at goal.    Atopic dermatitis (eczema) is a chronic, relapsing, pruritic condition that can significantly affect quality of life. It is often associated with allergic rhinitis and/or asthma and can require treatment with topical medications, phototherapy, or in severe cases biologic injectable medication (Dupixent; Adbry) or Oral JAK inhibitors.    Can try Eucrisa ointment qd / bid to face qd  Opzelura cream apply qd/bid to face qd  Patient will notify us to let us know which one she likes    Start Elidel cream bid to aas body, she will stop applying to face due to tingling side effects Mometasone cr qd/bid up to 4 weeks to aas body/ears prn flares Recommend SPF 30+ with zinc to face daily  Continue mild cleanser for face,   Continue mild soap and moisturizing cream 1-2 times daily. Pigmentation will  gradually fade over time, recommend daily sunscreen to sun-exposed skin  Discussed Dupixent injections if not improving       Topical steroids (such as triamcinolone, fluocinolone, fluocinonide, mometasone, clobetasol, halobetasol, betamethasone, hydrocortisone) can cause thinning and lightening of the skin if they are used for too long in the same area. Your physician has selected the right strength medicine for your problem and area affected on the body. Please use your medication only as directed by your physician to prevent side effects.    Related Medications pimecrolimus (ELIDEL) 1 % cream Apply topically as directed. Qd to Bid to face until clear, then prn flares of itchy rash  mometasone (ELOCON) 0.1 % cream Apply 1 Application topically as directed. Qd to bid aa itchy rash on body, arms, legs, ears for up to 4 weeks   Return for 3 - 4 month dermatitis follow up. I, Asher Muir, CMA, am acting as scribe for Willeen Niece, MD.  Documentation: I have reviewed the above documentation for accuracy and completeness, and I agree with the above.  Willeen Niece MD

## 2022-03-22 NOTE — Patient Instructions (Addendum)
Continue Mometasone cream - only use this for flared skin at arms, legs, ears as needed  Can use  Elidel 1 % cream -- can use at arms, legs, ears, and face daily for itchy areas of skin.   Can start Opzelura cream - sample given can apply to affected areas of face daily to twice daily ( also safe for body )  Can start Eucrisa Cream - apply topically to affected areas of face daily / twice daily (also safe for body)  Please call or send mychart message which sample you like and we will send prescription in for you  Continue mild soap Continue mild moisturizer   Discussed Vanicream as a good moisturizer   Gentle Skin Care Guide  1. Bathe no more than once a day.  2. Avoid bathing in hot water  3. Use a mild soap like Dove, Vanicream, Cetaphil, CeraVe. Can use Lever 2000 or Cetaphil antibacterial soap  4. Use soap only where you need it. On most days, use it under your arms, between your legs, and on your feet. Let the water rinse other areas unless visibly dirty.  5. When you get out of the bath/shower, use a towel to gently blot your skin dry, don't rub it.  6. While your skin is still a little damp, apply a moisturizing cream such as Vanicream, CeraVe, Cetaphil, Eucerin, Sarna lotion or plain Vaseline Jelly. For hands apply Neutrogena [place] Hand Cream or Excipial Hand Cream.  7. Reapply moisturizer any time you start to itch or feel dry.  8. Sometimes using free and clear laundry detergents can be helpful. Fabric softener sheets should be avoided. Downy Free & Gentle liquid, or any liquid fabric softener that is free of dyes and perfumes, it acceptable to use  9. If your doctor has given you prescription creams you may apply moisturizers over them     Topical steroids (such as triamcinolone, fluocinolone, fluocinonide, mometasone, clobetasol, halobetasol, betamethasone, hydrocortisone) can cause thinning and lightening of the skin if they are used for too long in the same  area. Your physician has selected the right strength medicine for your problem and area affected on the body. Please use your medication only as directed by your physician to prevent side effects.    Due to recent changes in healthcare laws, you may see results of your pathology and/or laboratory studies on MyChart before the doctors have had a chance to review them. We understand that in some cases there may be results that are confusing or concerning to you. Please understand that not all results are received at the same time and often the doctors may need to interpret multiple results in order to provide you with the best plan of care or course of treatment. Therefore, we ask that you please give [place] 2 business days to thoroughly review all your results before contacting the office for clarification. Should we see a critical lab result, you will be contacted sooner.   If You Need Anything After Your Visit  If you have any questions or concerns for your doctor, please call our main line at [phone] and press option 4 to reach your doctor's medical assistant. If no one answers, please leave a voicemail as directed and we will return your call as soon as possible. Messages left after 4 pm will be answered the following business day.   You may also send [place] a message via MyChart. We typically respond to MyChart messages within 1-2 business days.  For  prescription refills, please ask your pharmacy to contact our office. Our fax number is [phone].  If you have an urgent issue when the clinic is closed that cannot wait until the next business day, you can page your doctor at the number below.    Please note that while we do our best to be available for urgent issues outside of office hours, we are not available 24/7.   If you have an urgent issue and are unable to reach [place], you may choose to seek medical care at your doctor's office, retail clinic, urgent care center, or emergency room.  If  you have a medical emergency, please immediately call 911 or go to the emergency department.  Pager Numbers  - Dr. Nehemiah Massed: [phone]  - Dr. Laurence Ferrari: [phone]  - Dr. Nicole Kindred: [phone]  In the event of inclement weather, please call our main line at [phone] for an update on the status of any delays or closures.  Dermatology Medication Tips: Please keep the boxes that topical medications come in in order to help keep track of the instructions about where and how to use these. Pharmacies typically print the medication instructions only on the boxes and not directly on the medication tubes.   If your medication is too expensive, please contact our office at [phone] option 4 or send [place] a message through [place].   We are unable to tell what your co-pay for medications will be in advance as this is different depending on your insurance coverage. However, we may be able to find a substitute medication at lower cost or fill out paperwork to get insurance to cover a needed medication.   If a prior authorization is required to get your medication covered by your insurance company, please allow [place] 1-2 business days to complete this process.  Drug prices often vary depending on where the prescription is filled and some pharmacies may offer cheaper prices.  The website www.goodrx.com contains coupons for medications through different pharmacies. The prices here do not account for what the cost may be with help from insurance (it may be cheaper with your insurance), but the website can give you the price if you did not use any insurance.  - You can print the associated coupon and take it with your prescription to the pharmacy.  - You may also stop by our office during regular business hours and pick up a GoodRx coupon card.  - If you need your prescription sent electronically to a different pharmacy, notify our office through [facility] or by phone at [phone] option  4.     Si Usted Necesita Algo Despus de Su Visita  Tambin puede enviarnos un mensaje a travs de Pharmacist, community. Por lo general respondemos a los mensajes de MyChart en el transcurso de 1 a 2 das hbiles.  Para renovar recetas, por favor pida a su farmacia que se ponga en contacto con nuestra oficina. Harland Dingwall de fax es [place] [phone].  Si tiene un asunto urgente cuando la clnica est cerrada y que no puede esperar hasta el siguiente da hbil, puede llamar/localizar a su doctor(a) al nmero que aparece a continuacin.   Por favor, tenga en cuenta que aunque hacemos todo lo posible para estar disponibles para asuntos urgentes fuera del horario de [place], no estamos disponibles las 24 horas del da, los 7 das de la [place].   Si tiene un problema urgente y no puede comunicarse con nosotros, puede optar por buscar atencin mdica  en el  consultorio de su doctor(a), en una clnica privada, en un centro de atencin urgente o en una sala de emergencias.  Si tiene Engineer, drilling, por favor llame inmediatamente al 911 o vaya a la sala de emergencias.  Nmeros de bper  - Dr. Gwen Pounds: [phone]  - Dra. Moye: [phone]  - Dra. Roseanne Reno: [phone]  En caso de inclemencias del [place], por favor llame a Lacy Duverney principal al [phone] para una actualizacin sobre el [place] de cualquier retraso o cierre.  Consejos para la medicacin en dermatologa: Por favor, guarde las cajas en las que vienen los medicamentos de uso tpico para ayudarle a seguir las instrucciones sobre dnde y cmo usarlos. Las farmacias generalmente imprimen las instrucciones del medicamento slo en las cajas y no directamente en los tubos del [place].   Si su medicamento es muy caro, por favor, pngase en contacto con Rolm Gala llamando al [phone] y presione la opcin 4 o envenos un mensaje a travs de Clinical cytogeneticist.   No podemos decirle cul ser su copago por los medicamentos por  adelantado ya que esto es diferente dependiendo de la cobertura de su seguro. Sin embargo, es posible que podamos encontrar un medicamento sustituto a Audiological scientist un formulario para que el seguro cubra el medicamento que se considera necesario.   Si se requiere una autorizacin previa para que su compaa de seguros [place] su medicamento, por favor permtanos de 1 a 2 das hbiles para completar [address]  Los precios de los medicamentos varan con frecuencia dependiendo del Environmental consultant de dnde se surte la receta y alguna farmacias pueden ofrecer precios ms baratos.  El sitio web www.goodrx.com tiene cupones para medicamentos de Health and safety inspector. Los precios aqu no tienen en cuenta lo que podra costar con la ayuda del seguro (puede ser ms barato con su seguro), pero el sitio web puede darle el precio si no utiliz Tourist information centre manager.  - Puede imprimir el cupn correspondiente y llevarlo con su receta a la farmacia.  - Tambin puede pasar por nuestra oficina durante el horario de atencin regular y Education officer, museum una tarjeta de cupones de GoodRx.  - Si necesita que su receta se enve electrnicamente a una farmacia diferente, informe a nuestra oficina a travs de MyChart de [place] o por telfono llamando al [phone] y presione la opcin 4.

## 2022-08-23 ENCOUNTER — Ambulatory Visit: Payer: Managed Care, Other (non HMO) | Admitting: Dermatology

## 2022-11-02 ENCOUNTER — Other Ambulatory Visit: Payer: Self-pay | Admitting: Family Medicine

## 2022-11-02 DIAGNOSIS — Z1231 Encounter for screening mammogram for malignant neoplasm of breast: Secondary | ICD-10-CM

## 2022-12-26 ENCOUNTER — Ambulatory Visit
Admission: RE | Admit: 2022-12-26 | Discharge: 2022-12-26 | Disposition: A | Discharge status: Home / Self Care | Payer: Managed Care, Other (non HMO) | Source: Ambulatory Visit | Attending: Family Medicine | Admitting: Family Medicine

## 2022-12-26 DIAGNOSIS — Z1231 Encounter for screening mammogram for malignant neoplasm of breast: Secondary | ICD-10-CM | POA: Diagnosis present

## 2023-07-14 ENCOUNTER — Other Ambulatory Visit: Payer: Self-pay | Admitting: Medical Genetics

## 2023-07-15 ENCOUNTER — Other Ambulatory Visit: Payer: Self-pay

## 2023-11-27 ENCOUNTER — Other Ambulatory Visit: Payer: Self-pay | Admitting: Family Medicine

## 2023-11-27 DIAGNOSIS — Z1231 Encounter for screening mammogram for malignant neoplasm of breast: Secondary | ICD-10-CM

## 2024-01-01 ENCOUNTER — Ambulatory Visit
Admission: RE | Admit: 2024-01-01 | Discharge: 2024-01-01 | Disposition: A | Discharge status: Home / Self Care | Source: Ambulatory Visit | Attending: Family Medicine | Admitting: Family Medicine

## 2024-01-01 DIAGNOSIS — Z1231 Encounter for screening mammogram for malignant neoplasm of breast: Secondary | ICD-10-CM | POA: Diagnosis present
